# Patient Record
Sex: Female | Born: 1960 | Race: White | Hispanic: No | Marital: Married | Smoking: Former smoker
Health system: Southern US, Community
[De-identification: ages and names within clinical notes are randomized; demographics above are authoritative.]

## PROBLEM LIST (undated history)

## (undated) DIAGNOSIS — N393 Stress incontinence (female) (male): Secondary | ICD-10-CM

## (undated) DIAGNOSIS — J302 Other seasonal allergic rhinitis: Secondary | ICD-10-CM

## (undated) DIAGNOSIS — Z780 Asymptomatic menopausal state: Secondary | ICD-10-CM

## (undated) DIAGNOSIS — M1990 Unspecified osteoarthritis, unspecified site: Secondary | ICD-10-CM

## (undated) DIAGNOSIS — Z973 Presence of spectacles and contact lenses: Secondary | ICD-10-CM

## (undated) DIAGNOSIS — Z90710 Acquired absence of both cervix and uterus: Secondary | ICD-10-CM

## (undated) DIAGNOSIS — Z8742 Personal history of other diseases of the female genital tract: Secondary | ICD-10-CM

## (undated) DIAGNOSIS — N952 Postmenopausal atrophic vaginitis: Secondary | ICD-10-CM

## (undated) DIAGNOSIS — I10 Essential (primary) hypertension: Secondary | ICD-10-CM

## (undated) HISTORY — PX: PARTIAL HYSTERECTOMY: SHX80

## (undated) HISTORY — DX: Personal history of other diseases of the female genital tract: Z87.42

## (undated) HISTORY — DX: Asymptomatic menopausal state: Z78.0

## (undated) HISTORY — DX: Stress incontinence (female) (male): N39.3

## (undated) HISTORY — DX: Acquired absence of both cervix and uterus: Z90.710

## (undated) HISTORY — DX: Postmenopausal atrophic vaginitis: N95.2

## (undated) HISTORY — PX: DILATION AND CURETTAGE OF UTERUS: SHX78

## (undated) HISTORY — PX: TUBAL LIGATION: SHX77

## (undated) HISTORY — PX: ABDOMINAL HYSTERECTOMY: SHX81

---

## 2004-12-06 ENCOUNTER — Ambulatory Visit: Payer: Self-pay | Admitting: Unknown Physician Specialty

## 2004-12-07 ENCOUNTER — Ambulatory Visit: Payer: Self-pay | Admitting: Orthopedic Surgery

## 2007-12-06 ENCOUNTER — Ambulatory Visit: Payer: Self-pay | Admitting: Family Medicine

## 2008-02-29 HISTORY — PX: FOOT ARTHROTOMY: SHX952

## 2013-08-13 LAB — HM PAP SMEAR: HM Pap smear: POSITIVE

## 2013-10-08 ENCOUNTER — Ambulatory Visit: Payer: Self-pay | Admitting: Obstetrics and Gynecology

## 2013-10-09 LAB — HM MAMMOGRAPHY

## 2014-06-12 ENCOUNTER — Other Ambulatory Visit: Payer: Self-pay | Admitting: Orthopedic Surgery

## 2014-06-26 ENCOUNTER — Encounter (HOSPITAL_BASED_OUTPATIENT_CLINIC_OR_DEPARTMENT_OTHER): Payer: Self-pay | Admitting: *Deleted

## 2014-06-26 NOTE — Progress Notes (Signed)
No labs needed

## 2014-06-27 NOTE — H&P (Signed)
Kelsey Holt is an 54 y.o. female.   Chief Complaint: Left knee pain  HPI: Patient returns today with continued pain in her left knee which by MRI scan has a lateral meniscal tear and bicompartmental osteoarthritis.  The workers Production assistant, radio asked for second opinion, which is pending and therefore we are in a holding pattern.  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  Past Medical History  Diagnosis Date  . Seasonal allergies   . Arthritis   . Wears glasses     Past Surgical History  Procedure Laterality Date  . Partial hysterectomy    . Foot arthrotomy  2010    bilateral toes repaired  . Dilation and curettage of uterus    . Tubal ligation    . Abdominal hysterectomy      History reviewed. No pertinent family history. Social History:  reports that she quit smoking about 2 years ago. She does not have any smokeless tobacco history on file. She reports that she drinks alcohol. She reports that she does not use illicit drugs.  Allergies:  Allergies  Allergen Reactions  . Penicillins Hives    No prescriptions prior to admission    No results found for this or any previous visit (from the past 74 hour(s)). No results found.  Review of Systems  Constitutional:       Hot flashes  HENT: Negative.   Eyes: Negative.   Respiratory: Negative.   Cardiovascular: Negative.   Gastrointestinal: Negative.   Genitourinary: Negative.   Musculoskeletal: Positive for joint pain.  Skin: Negative.   Neurological: Negative.   Endo/Heme/Allergies: Negative.   Psychiatric/Behavioral: Negative.     Height 5\' 7"  (1.702 m), weight 87.544 kg (193 lb). Physical Exam  Constitutional: She is oriented to person, place, and time. She appears well-developed and well-nourished.  HENT:  Head: Normocephalic and atraumatic.  Eyes: Pupils are equal, round, and reactive to light.  Neck: Normal range of motion. Neck supple.  Cardiovascular: Intact distal  pulses.   Respiratory: Effort normal.  Musculoskeletal: She exhibits tenderness.  The left knee has a 1-2+ effusion is tender along the lateral joint line.  Motion today is from 0-130.    Neurological: She is alert and oriented to person, place, and time.  Skin: Skin is warm and dry.  Psychiatric: She has a normal mood and affect. Her behavior is normal. Judgment and thought content normal.     Assessment/Plan Assess: Symptomatic lateral meniscal tear.  Date of injury was 09/11/2013 pending approval for arthroscopic decompression.  Plan: Treatment options are discussed with the patient.  The benefits and risks of knee arthroscopy are discussed.  She wishes to proceed with knee arthroscopy.  We anticipate seeing the patient back at the time of surgical intervention and then approximately 10 days post op.  Call with any issuse   Marieclaire Bettenhausen R 06/27/2014, 10:13 AM

## 2014-06-30 ENCOUNTER — Encounter (HOSPITAL_BASED_OUTPATIENT_CLINIC_OR_DEPARTMENT_OTHER)
Admission: RE | Disposition: A | Discharge status: Home / Self Care | Payer: Self-pay | Source: Ambulatory Visit | Attending: Orthopedic Surgery

## 2014-06-30 ENCOUNTER — Ambulatory Visit (HOSPITAL_BASED_OUTPATIENT_CLINIC_OR_DEPARTMENT_OTHER)
Admission: RE | Admit: 2014-06-30 | Discharge: 2014-06-30 | Disposition: A | Discharge status: Home / Self Care | Payer: Worker's Compensation | Source: Ambulatory Visit | Attending: Orthopedic Surgery | Admitting: Orthopedic Surgery

## 2014-06-30 ENCOUNTER — Encounter (HOSPITAL_BASED_OUTPATIENT_CLINIC_OR_DEPARTMENT_OTHER): Payer: Self-pay | Admitting: Anesthesiology

## 2014-06-30 ENCOUNTER — Ambulatory Visit (HOSPITAL_BASED_OUTPATIENT_CLINIC_OR_DEPARTMENT_OTHER): Payer: Worker's Compensation | Admitting: Anesthesiology

## 2014-06-30 DIAGNOSIS — M23222 Derangement of posterior horn of medial meniscus due to old tear or injury, left knee: Secondary | ICD-10-CM | POA: Diagnosis not present

## 2014-06-30 DIAGNOSIS — M1712 Unilateral primary osteoarthritis, left knee: Secondary | ICD-10-CM | POA: Insufficient documentation

## 2014-06-30 DIAGNOSIS — M2342 Loose body in knee, left knee: Secondary | ICD-10-CM | POA: Diagnosis not present

## 2014-06-30 DIAGNOSIS — Z87891 Personal history of nicotine dependence: Secondary | ICD-10-CM | POA: Insufficient documentation

## 2014-06-30 DIAGNOSIS — Z90710 Acquired absence of both cervix and uterus: Secondary | ICD-10-CM | POA: Insufficient documentation

## 2014-06-30 DIAGNOSIS — M23252 Derangement of posterior horn of lateral meniscus due to old tear or injury, left knee: Secondary | ICD-10-CM | POA: Insufficient documentation

## 2014-06-30 DIAGNOSIS — Z880 Allergy status to penicillin: Secondary | ICD-10-CM | POA: Insufficient documentation

## 2014-06-30 DIAGNOSIS — Z9851 Tubal ligation status: Secondary | ICD-10-CM | POA: Diagnosis not present

## 2014-06-30 DIAGNOSIS — S83242A Other tear of medial meniscus, current injury, left knee, initial encounter: Secondary | ICD-10-CM

## 2014-06-30 DIAGNOSIS — M94262 Chondromalacia, left knee: Secondary | ICD-10-CM | POA: Diagnosis not present

## 2014-06-30 HISTORY — DX: Presence of spectacles and contact lenses: Z97.3

## 2014-06-30 HISTORY — DX: Other seasonal allergic rhinitis: J30.2

## 2014-06-30 HISTORY — PX: KNEE ARTHROSCOPY WITH MEDIAL MENISECTOMY: SHX5651

## 2014-06-30 HISTORY — DX: Unspecified osteoarthritis, unspecified site: M19.90

## 2014-06-30 LAB — POCT HEMOGLOBIN-HEMACUE: Hemoglobin: 14.9 g/dL (ref 12.0–15.0)

## 2014-06-30 SURGERY — ARTHROSCOPY, KNEE, WITH MEDIAL MENISCECTOMY
Anesthesia: General | Site: Knee | Laterality: Left

## 2014-06-30 MED ORDER — PROMETHAZINE HCL 25 MG/ML IJ SOLN
6.2500 mg | INTRAMUSCULAR | Status: DC | PRN
Start: 2014-06-30 — End: 2014-06-30

## 2014-06-30 MED ORDER — LACTATED RINGERS IV SOLN
INTRAVENOUS | Status: DC
  Administered 2014-06-30: 10:00:00 via INTRAVENOUS

## 2014-06-30 MED ORDER — FENTANYL CITRATE (PF) 100 MCG/2ML IJ SOLN
INTRAMUSCULAR | Status: AC
  Filled 2014-06-30: qty 4

## 2014-06-30 MED ORDER — CHLORHEXIDINE GLUCONATE 4 % EX LIQD: 60.0000 mL | Freq: Once | CUTANEOUS | Status: DC

## 2014-06-30 MED ORDER — GLYCOPYRROLATE 0.2 MG/ML IJ SOLN: 0.2000 mg | Freq: Once | INTRAMUSCULAR | Status: DC | PRN

## 2014-06-30 MED ORDER — FENTANYL CITRATE (PF) 100 MCG/2ML IJ SOLN
INTRAMUSCULAR | Status: DC | PRN
  Administered 2014-06-30: 100 ug via INTRAVENOUS

## 2014-06-30 MED ORDER — BUPIVACAINE-EPINEPHRINE 0.5% -1:200000 IJ SOLN
INTRAMUSCULAR | Status: DC | PRN
  Administered 2014-06-30: 20 mL

## 2014-06-30 MED ORDER — ONDANSETRON HCL 4 MG/2ML IJ SOLN
INTRAMUSCULAR | Status: DC | PRN
  Administered 2014-06-30: 4 mg via INTRAVENOUS

## 2014-06-30 MED ORDER — PROPOFOL 500 MG/50ML IV EMUL
INTRAVENOUS | Status: AC
  Filled 2014-06-30: qty 50

## 2014-06-30 MED ORDER — MIDAZOLAM HCL 2 MG/2ML IJ SOLN
INTRAMUSCULAR | Status: AC
  Filled 2014-06-30: qty 2

## 2014-06-30 MED ORDER — CEFAZOLIN SODIUM-DEXTROSE 2-3 GM-% IV SOLR
INTRAVENOUS | Status: AC
  Filled 2014-06-30: qty 50

## 2014-06-30 MED ORDER — HYDROMORPHONE HCL 1 MG/ML IJ SOLN
0.2500 mg | INTRAMUSCULAR | Status: DC | PRN
  Administered 2014-06-30 (×2): 0.5 mg via INTRAVENOUS

## 2014-06-30 MED ORDER — BUPIVACAINE HCL (PF) 0.5 % IJ SOLN
INTRAMUSCULAR | Status: AC
  Filled 2014-06-30: qty 30

## 2014-06-30 MED ORDER — HYDROMORPHONE HCL 1 MG/ML IJ SOLN
INTRAMUSCULAR | Status: AC
  Filled 2014-06-30: qty 1

## 2014-06-30 MED ORDER — MIDAZOLAM HCL 2 MG/2ML IJ SOLN
1.0000 mg | INTRAMUSCULAR | Status: DC | PRN
  Administered 2014-06-30: 2 mg via INTRAVENOUS

## 2014-06-30 MED ORDER — FENTANYL CITRATE (PF) 100 MCG/2ML IJ SOLN: 50.0000 ug | INTRAMUSCULAR | Status: DC | PRN

## 2014-06-30 MED ORDER — HYDROCODONE-ACETAMINOPHEN 5-325 MG PO TABS: 1.0000 | ORAL_TABLET | Freq: Four times a day (QID) | ORAL | Status: DC | PRN

## 2014-06-30 MED ORDER — DEXTROSE-NACL 5-0.45 % IV SOLN: INTRAVENOUS | Status: DC

## 2014-06-30 MED ORDER — CEFAZOLIN SODIUM-DEXTROSE 2-3 GM-% IV SOLR
2.0000 g | INTRAVENOUS | Status: AC
  Administered 2014-06-30: 2 g via INTRAVENOUS

## 2014-06-30 MED ORDER — SODIUM CHLORIDE 0.9 % IR SOLN
Status: DC | PRN
  Administered 2014-06-30: 3100 mL

## 2014-06-30 MED ORDER — DEXAMETHASONE SODIUM PHOSPHATE 4 MG/ML IJ SOLN
INTRAMUSCULAR | Status: DC | PRN
  Administered 2014-06-30: 10 mg via INTRAVENOUS

## 2014-06-30 MED ORDER — LIDOCAINE HCL (CARDIAC) 20 MG/ML IV SOLN
INTRAVENOUS | Status: DC | PRN
  Administered 2014-06-30: 80 mg via INTRAVENOUS

## 2014-06-30 MED ORDER — PROPOFOL 10 MG/ML IV BOLUS
INTRAVENOUS | Status: DC | PRN
  Administered 2014-06-30: 200 mg via INTRAVENOUS

## 2014-06-30 SURGICAL SUPPLY — 42 items
BANDAGE ELASTIC 6 VELCRO ST LF (GAUZE/BANDAGES/DRESSINGS) ×4 IMPLANT
BLADE 4.2CUDA (BLADE) IMPLANT
BLADE CUTTER GATOR 3.5 (BLADE) IMPLANT
BLADE GREAT WHITE 4.2 (BLADE) IMPLANT
BLADE GREAT WHITE 4.2MM (BLADE)
BNDG COHESIVE 6X5 TAN STRL LF (GAUZE/BANDAGES/DRESSINGS) ×4 IMPLANT
DRAPE ARTHROSCOPY W/POUCH 114 (DRAPES) ×4 IMPLANT
DURAPREP 26ML APPLICATOR (WOUND CARE) ×4 IMPLANT
ELECT MENISCUS 165MM 90D (ELECTRODE) IMPLANT
ELECT REM PT RETURN 9FT ADLT (ELECTROSURGICAL)
ELECTRODE REM PT RTRN 9FT ADLT (ELECTROSURGICAL) IMPLANT
GAUZE SPONGE 4X4 12PLY STRL (GAUZE/BANDAGES/DRESSINGS) ×4 IMPLANT
GAUZE XEROFORM 1X8 LF (GAUZE/BANDAGES/DRESSINGS) ×4 IMPLANT
GLOVE BIO SURGEON STRL SZ 6.5 (GLOVE) ×3 IMPLANT
GLOVE BIO SURGEON STRL SZ7.5 (GLOVE) ×4 IMPLANT
GLOVE BIO SURGEON STRL SZ8.5 (GLOVE) ×4 IMPLANT
GLOVE BIO SURGEONS STRL SZ 6.5 (GLOVE) ×1
GLOVE BIOGEL M STRL SZ7.5 (GLOVE) ×4 IMPLANT
GLOVE BIOGEL PI IND STRL 8 (GLOVE) ×4 IMPLANT
GLOVE BIOGEL PI IND STRL 9 (GLOVE) ×2 IMPLANT
GLOVE BIOGEL PI INDICATOR 8 (GLOVE) ×4
GLOVE BIOGEL PI INDICATOR 9 (GLOVE) ×2
GOWN STRL REUS W/ TWL LRG LVL3 (GOWN DISPOSABLE) ×2 IMPLANT
GOWN STRL REUS W/TWL LRG LVL3 (GOWN DISPOSABLE) ×2
GOWN STRL REUS W/TWL XL LVL3 (GOWN DISPOSABLE) ×4 IMPLANT
IV NS IRRIG 3000ML ARTHROMATIC (IV SOLUTION) ×4 IMPLANT
KNEE WRAP E Z 3 GEL PACK (MISCELLANEOUS) ×4 IMPLANT
MANIFOLD NEPTUNE II (INSTRUMENTS) ×4 IMPLANT
NDL SAFETY ECLIPSE 18X1.5 (NEEDLE) ×2 IMPLANT
NEEDLE HYPO 18GX1.5 SHARP (NEEDLE) ×2
PACK ARTHROSCOPY DSU (CUSTOM PROCEDURE TRAY) ×4 IMPLANT
PACK BASIN DAY SURGERY FS (CUSTOM PROCEDURE TRAY) ×4 IMPLANT
PAD ALCOHOL SWAB (MISCELLANEOUS) ×4 IMPLANT
PENCIL BUTTON HOLSTER BLD 10FT (ELECTRODE) IMPLANT
SET ARTHROSCOPY TUBING (MISCELLANEOUS) ×2
SET ARTHROSCOPY TUBING LN (MISCELLANEOUS) ×2 IMPLANT
SLEEVE SCD COMPRESS KNEE MED (MISCELLANEOUS) IMPLANT
SYR 3ML 18GX1 1/2 (SYRINGE) IMPLANT
SYR 5ML LL (SYRINGE) ×4 IMPLANT
TOWEL OR 17X24 6PK STRL BLUE (TOWEL DISPOSABLE) ×4 IMPLANT
WAND STAR VAC 90 (SURGICAL WAND) ×4 IMPLANT
WATER STERILE IRR 1000ML POUR (IV SOLUTION) ×4 IMPLANT

## 2014-06-30 NOTE — Discharge Instructions (Addendum)
Arthroscopic Procedure, Knee An arthroscopic procedure can find what is wrong with your knee. PROCEDURE Arthroscopy is a surgical technique that allows your orthopedic surgeon to diagnose and treat your knee injury with accuracy. They will look into your knee through a small instrument. This is almost like a small (pencil sized) telescope. Because arthroscopy affects your knee less than open knee surgery, you can anticipate a more rapid recovery. Taking an active role by following your caregiver's instructions will help with rapid and complete recovery. Use crutches, rest, elevation, ice, and knee exercises as instructed. The length of recovery depends on various factors including type of injury, age, physical condition, medical conditions, and your rehabilitation. Your knee is the joint between the large bones (femur and tibia) in your leg. Cartilage covers these bone ends which are smooth and slippery and allow your knee to bend and move smoothly. Two menisci, thick, semi-lunar shaped pads of cartilage which form a rim inside the joint, help absorb shock and stabilize your knee. Ligaments bind the bones together and support your knee joint. Muscles move the joint, help support your knee, and take stress off the joint itself. Because of this all programs and physical therapy to rehabilitate an injured or repaired knee require rebuilding and strengthening your muscles. AFTER THE PROCEDURE  After the procedure, you will be moved to a recovery area until most of the effects of the medication have worn off. Your caregiver will discuss the test results with you.  Only take over-the-counter or prescription medicines for pain, discomfort, or fever as directed by your caregiver. SEEK MEDICAL CARE IF:   You have increased bleeding from your wounds.  You see redness, swelling, or have increasing pain in your wounds.  You have pus coming from your wound.  You have an oral temperature above 102 F (38.9  C).  You notice a bad smell coming from the wound or dressing.  You have severe pain with any motion of your knee. SEEK IMMEDIATE MEDICAL CARE IF:   You develop a rash.  You have difficulty breathing.  You have any allergic problems. Document Released: 02/12/2000 Document Revised: 05/09/2011 Document Reviewed: 09/05/2007 [facility] Patient Information 2015 [place], [place]. This information is not intended to replace advice given to you by your health care provider. Make sure you discuss any questions you have with your health care provider.    Post Anesthesia Home Care Instructions  Activity: Get plenty of rest for the remainder of the day. A responsible adult should stay with you for 24 hours following the procedure.  For the next 24 hours, DO NOT: -Drive a car -Paediatric nurse -Drink alcoholic beverages -Take any medication unless instructed by your physician -Make any legal decisions or sign important papers.  Meals: Start with liquid foods such as gelatin or soup. Progress to regular foods as tolerated. Avoid greasy, spicy, heavy foods. If nausea and/or vomiting occur, drink only clear liquids until the nausea and/or vomiting subsides. Call your physician if vomiting continues.  Special Instructions/Symptoms: Your throat may feel dry or sore from the anesthesia or the breathing tube placed in your throat during surgery. If this causes discomfort, gargle with warm salt water. The discomfort should disappear within 24 hours.  If you had a scopolamine patch placed behind your ear for the management of post- operative nausea and/or vomiting:  1. The medication in the patch is effective for 72 hours, after which it should be removed.  Wrap patch in a tissue and discard in the trash.  Wash hands thoroughly with soap and water. 2. You may remove the patch earlier than 72 hours if you experience unpleasant side effects which may include dry mouth, dizziness or visual disturbances. 3.  Avoid touching the patch. Wash your hands with soap and water after contact with the patch.

## 2014-06-30 NOTE — Anesthesia Preprocedure Evaluation (Addendum)
Anesthesia Evaluation  Patient identified by MRN, date of birth, ID band Patient awake    Reviewed: Allergy & Precautions, NPO status , Patient's Chart, lab work & pertinent test results  Airway Mallampati: II  TM Distance: >3 FB Neck ROM: Full    Dental   Pulmonary former smoker,  breath sounds clear to auscultation        Cardiovascular negative cardio ROS  Rhythm:Regular Rate:Normal     Neuro/Psych    GI/Hepatic negative GI ROS, Neg liver ROS,   Endo/Other  negative endocrine ROS  Renal/GU negative Renal ROS     Musculoskeletal  (+) Arthritis -,   Abdominal   Peds  Hematology   Anesthesia Other Findings   Reproductive/Obstetrics                          Anesthesia Physical Anesthesia Plan  ASA: II  Anesthesia Plan: General   Post-op Pain Management:    Induction: Intravenous  Airway Management Planned: LMA  Additional Equipment:   Intra-op Plan:   Post-operative Plan: Extubation in OR  Informed Consent:   Dental advisory given  Plan Discussed with: CRNA and Anesthesiologist  Anesthesia Plan Comments:         Anesthesia Quick Evaluation

## 2014-06-30 NOTE — Anesthesia Procedure Notes (Signed)
Procedure Name: LMA Insertion Date/Time: 06/30/2014 11:55 AM Performed by: Maryella Shivers Pre-anesthesia Checklist: Patient identified, Emergency Drugs available, Suction available and Patient being monitored Patient Re-evaluated:Patient Re-evaluated prior to inductionOxygen Delivery Method: Circle System Utilized Preoxygenation: Pre-oxygenation with 100% oxygen Intubation Type: IV induction Ventilation: Mask ventilation without difficulty LMA: LMA inserted LMA Size: 4.0 Number of attempts: 1 Airway Equipment and Method: Bite block Placement Confirmation: positive ETCO2 Tube secured with: Tape Dental Injury: Teeth and Oropharynx as per pre-operative assessment

## 2014-06-30 NOTE — Interval H&P Note (Signed)
History and Physical Interval Note:  06/30/2014 11:39 AM  Kelsey Holt  has presented today for surgery, with the diagnosis of LEFT KNEE MENISCUS TEAR CHONDROMALACIA  The various methods of treatment have been discussed with the patient and family. After consideration of risks, benefits and other options for treatment, the patient has consented to  Procedure(s): LEFT ARTHROSCOPY KNEE (Left) as a surgical intervention .  The patient's history has been reviewed, patient examined, no change in status, stable for surgery.  I have reviewed the patient's chart and labs.  Questions were answered to the patient's satisfaction.     Kerin Salen

## 2014-06-30 NOTE — Anesthesia Postprocedure Evaluation (Signed)
  Anesthesia Post-op Note  Patient: Kelsey Holt  Procedure(s) Performed: Procedure(s) with comments: KNEE ARTHROSCOPY WITH MEDIAL MENISECTOMY (Left) - chondromalatia loose bodly removal partial lateral and medial menisectomy  Patient Location: PACU  Anesthesia Type:General  Level of Consciousness: awake  Airway and Oxygen Therapy: Patient Spontanous Breathing  Post-op Pain: mild  Post-op Assessment: Post-op Vital signs reviewed  Post-op Vital Signs: Reviewed  Last Vitals:  Filed Vitals:   06/30/14 1345  BP:   Pulse:   Temp: 36 C  Resp:     Complications: No apparent anesthesia complications

## 2014-06-30 NOTE — Op Note (Signed)
Pre-Op Dx: Left knee lateral meniscal tear  Postop Dx: Left knee medial and lateral meniscal tears posterior horn complex. BX, cartilaginous loose bodies, grade 3 chondromalacia with flap tears   Procedure: Left knee arthroscopic partial medial and lateral meniscectomies, removal of loose bodies, debridement chondromalacia with flap tears medial and lateral femoral condyles focal  Surgeon: Kathalene Frames. Mayer Camel M.D.  Assist: Kerry Hough. Barton Dubois  (present throughout entire procedure and necessary for timely completion of the procedure) Anes: General LMA  EBL: Minimal  Fluids: 800 cc   Indications: Catching popping and pain with MRI scan consistent with large complex lateral meniscal tear. Pt has failed conservative treatment with anti-inflammatory medicines, physical therapy, and modified activites but did get good temporarily from an intra-articular cortisone injection. Pain has recurred and patient desires elective arthroscopic evaluation and treatment of knee. Risks and benefits of surgery have been discussed and questions answered.  Procedure: Patient identified by arm band and taken to the operating room at the day surgery Center. The appropriate anesthetic monitors were attached, and General LMA anesthesia was induced without difficulty. Lateral post was applied to the table and the lower extremity was prepped and draped in usual sterile fashion from the ankle to the midthigh. Time out procedure was performed. We began the operation by making standard inferior lateral and inferior medial peripatellar portals with a #11 blade allowing introduction of the arthroscope through the inferior lateral portal and the out flow to the inferior medial portal. Pump pressure was set at 100 mmHg and diagnostic arthroscopy  revealed grade 2-3 chondromalacia apex of the patella that was debrided with a 3.5 mm Gator sucker shaver moving into the medial compartment we identified a 2 cm area of grade 3 chondromalacia with  peripheral flap tears to the medial femoral condyle debrided, posterior horn medial meniscal tear parrot-beak and complex debrided with a straight biter and a 35 Gator sucker shaver. Anterior cruciate ligament and PCL are intact. On the lateral side complex parrot-beak tear posterior horn was also debrided as well as chondromalacia grade 3 with flap tears to the lateral femoral condyle. We also encountered a 1 cm cartilaginous loose body on the lateral side as well as removed with the sucker shaver. The knee was irrigated out normal saline solution. A dressing of xerofoam 4 x 4 dressing sponges, web roll and an Ace wrap was applied. The patient was awakened extubated and taken to the recovery without difficulty.    Signed: Kerin Salen, MD

## 2014-06-30 NOTE — Transfer of Care (Signed)
Immediate Anesthesia Transfer of Care Note  Patient: Kelsey Holt  Procedure(s) Performed: Procedure(s) with comments: KNEE ARTHROSCOPY WITH MEDIAL MENISECTOMY (Left) - chondromalatia loose bodly removal partial lateral and medial menisectomy  Patient Location: PACU  Anesthesia Type:General  Level of Consciousness: sedated  Airway & Oxygen Therapy: Patient Spontanous Breathing and Patient connected to face mask oxygen  Post-op Assessment: Report given to RN and Post -op Vital signs reviewed and stable  Post vital signs: Reviewed and stable  Last Vitals:  Filed Vitals:   06/30/14 0946  BP: 133/78  Pulse: 56  Temp: 36.7 C  Resp: 16    Complications: No apparent anesthesia complications

## 2014-07-03 ENCOUNTER — Encounter (HOSPITAL_BASED_OUTPATIENT_CLINIC_OR_DEPARTMENT_OTHER): Payer: Self-pay | Admitting: Orthopedic Surgery

## 2014-08-19 ENCOUNTER — Encounter: Payer: Self-pay | Admitting: Obstetrics and Gynecology

## 2014-11-04 ENCOUNTER — Encounter: Payer: Self-pay | Admitting: Obstetrics and Gynecology

## 2014-11-18 ENCOUNTER — Encounter: Payer: Self-pay | Admitting: Obstetrics and Gynecology

## 2015-01-28 DIAGNOSIS — M159 Polyosteoarthritis, unspecified: Secondary | ICD-10-CM | POA: Insufficient documentation

## 2015-01-28 DIAGNOSIS — R030 Elevated blood-pressure reading, without diagnosis of hypertension: Secondary | ICD-10-CM | POA: Insufficient documentation

## 2015-01-28 DIAGNOSIS — M1712 Unilateral primary osteoarthritis, left knee: Secondary | ICD-10-CM | POA: Insufficient documentation

## 2015-02-25 ENCOUNTER — Other Ambulatory Visit: Payer: Self-pay | Admitting: Internal Medicine

## 2015-02-25 DIAGNOSIS — Z1231 Encounter for screening mammogram for malignant neoplasm of breast: Secondary | ICD-10-CM

## 2015-03-11 ENCOUNTER — Ambulatory Visit
Admission: RE | Admit: 2015-03-11 | Discharge: 2015-03-11 | Disposition: A | Discharge status: Home / Self Care | Payer: Managed Care, Other (non HMO) | Source: Ambulatory Visit | Attending: Internal Medicine | Admitting: Internal Medicine

## 2015-03-11 DIAGNOSIS — Z1231 Encounter for screening mammogram for malignant neoplasm of breast: Secondary | ICD-10-CM | POA: Diagnosis present

## 2015-04-30 ENCOUNTER — Encounter: Payer: Self-pay | Admitting: *Deleted

## 2015-05-01 ENCOUNTER — Ambulatory Visit
Admission: RE | Admit: 2015-05-01 | Discharge: 2015-05-01 | Disposition: A | Discharge status: Home / Self Care | Payer: 59 | Source: Ambulatory Visit | Attending: Gastroenterology | Admitting: Gastroenterology

## 2015-05-01 ENCOUNTER — Ambulatory Visit: Payer: 59 | Admitting: Registered Nurse

## 2015-05-01 ENCOUNTER — Encounter: Payer: Self-pay | Admitting: Anesthesiology

## 2015-05-01 ENCOUNTER — Encounter
Admission: RE | Disposition: A | Discharge status: Home / Self Care | Payer: Self-pay | Source: Ambulatory Visit | Attending: Gastroenterology

## 2015-05-01 DIAGNOSIS — I10 Essential (primary) hypertension: Secondary | ICD-10-CM | POA: Diagnosis not present

## 2015-05-01 DIAGNOSIS — D120 Benign neoplasm of cecum: Secondary | ICD-10-CM | POA: Insufficient documentation

## 2015-05-01 DIAGNOSIS — M1990 Unspecified osteoarthritis, unspecified site: Secondary | ICD-10-CM | POA: Insufficient documentation

## 2015-05-01 DIAGNOSIS — K5730 Diverticulosis of large intestine without perforation or abscess without bleeding: Secondary | ICD-10-CM | POA: Insufficient documentation

## 2015-05-01 DIAGNOSIS — K640 First degree hemorrhoids: Secondary | ICD-10-CM | POA: Insufficient documentation

## 2015-05-01 DIAGNOSIS — Z1211 Encounter for screening for malignant neoplasm of colon: Secondary | ICD-10-CM | POA: Insufficient documentation

## 2015-05-01 DIAGNOSIS — Z90710 Acquired absence of both cervix and uterus: Secondary | ICD-10-CM | POA: Diagnosis not present

## 2015-05-01 DIAGNOSIS — Z87891 Personal history of nicotine dependence: Secondary | ICD-10-CM | POA: Diagnosis not present

## 2015-05-01 DIAGNOSIS — K644 Residual hemorrhoidal skin tags: Secondary | ICD-10-CM | POA: Insufficient documentation

## 2015-05-01 DIAGNOSIS — Z79899 Other long term (current) drug therapy: Secondary | ICD-10-CM | POA: Insufficient documentation

## 2015-05-01 DIAGNOSIS — Z880 Allergy status to penicillin: Secondary | ICD-10-CM | POA: Insufficient documentation

## 2015-05-01 HISTORY — DX: Essential (primary) hypertension: I10

## 2015-05-01 HISTORY — PX: COLONOSCOPY WITH PROPOFOL: SHX5780

## 2015-05-01 SURGERY — COLONOSCOPY WITH PROPOFOL
Anesthesia: General

## 2015-05-01 MED ORDER — PROPOFOL 500 MG/50ML IV EMUL
INTRAVENOUS | Status: DC | PRN
  Administered 2015-05-01: 175 ug/kg/min via INTRAVENOUS

## 2015-05-01 MED ORDER — MIDAZOLAM HCL 2 MG/2ML IJ SOLN
INTRAMUSCULAR | Status: DC | PRN
  Administered 2015-05-01: 1 mg via INTRAVENOUS

## 2015-05-01 MED ORDER — LIDOCAINE HCL (PF) 1 % IJ SOLN
2.0000 mL | Freq: Once | INTRAMUSCULAR | Status: AC
  Administered 2015-05-01: 0.03 mL via INTRADERMAL

## 2015-05-01 MED ORDER — FENTANYL CITRATE (PF) 100 MCG/2ML IJ SOLN
INTRAMUSCULAR | Status: DC | PRN
  Administered 2015-05-01: 50 ug via INTRAVENOUS

## 2015-05-01 MED ORDER — LIDOCAINE HCL (CARDIAC) 20 MG/ML IV SOLN
INTRAVENOUS | Status: DC | PRN
  Administered 2015-05-01: 40 mg via INTRAVENOUS

## 2015-05-01 MED ORDER — SODIUM CHLORIDE 0.9 % IV SOLN
INTRAVENOUS | Status: DC
  Administered 2015-05-01: 1000 mL via INTRAVENOUS

## 2015-05-01 MED ORDER — LIDOCAINE HCL (PF) 1 % IJ SOLN
INTRAMUSCULAR | Status: AC
  Administered 2015-05-01: 0.03 mL via INTRADERMAL
  Filled 2015-05-01: qty 2

## 2015-05-01 MED ORDER — PROPOFOL 10 MG/ML IV BOLUS
INTRAVENOUS | Status: DC | PRN
  Administered 2015-05-01: 50 mg via INTRAVENOUS

## 2015-05-01 NOTE — Anesthesia Procedure Notes (Signed)
Date/Time: 05/01/2015 10:19 AM Performed by: Doreen Salvage Pre-anesthesia Checklist: Patient identified, Emergency Drugs available, Suction available and Patient being monitored Patient Re-evaluated:Patient Re-evaluated prior to inductionOxygen Delivery Method: Nasal cannula Intubation Type: IV induction Dental Injury: Teeth and Oropharynx as per pre-operative assessment  Comments: Nasal cannula with etCO2 monitoring

## 2015-05-01 NOTE — Discharge Instructions (Signed)

## 2015-05-01 NOTE — Anesthesia Postprocedure Evaluation (Signed)
Anesthesia Post Note  Patient: Kelsey Holt  Procedure(s) Performed: Procedure(s) (LRB): COLONOSCOPY WITH PROPOFOL (N/A)  Patient location during evaluation: Endoscopy Anesthesia Type: General Level of consciousness: awake and alert Pain management: pain level controlled Vital Signs Assessment: post-procedure vital signs reviewed and stable Respiratory status: spontaneous breathing, nonlabored ventilation, respiratory function stable and patient connected to nasal cannula oxygen Cardiovascular status: blood pressure returned to baseline and stable Postop Assessment: no signs of nausea or vomiting Anesthetic complications: no    Last Vitals:  Filed Vitals:   05/01/15 1120 05/01/15 1125  BP:  117/71  Pulse: 62 65  Temp:    Resp: 17 14    Last Pain: There were no vitals filed for this visit.               Precious Haws Piscitello

## 2015-05-01 NOTE — Op Note (Signed)
[facility] Gastroenterology Patient Name: Kelsey Holt Procedure Date: 05/01/2015 10:16 AM MRN: [id number] Account #: [id number] Date of Birth: [birth year] Admit Type: Outpatient Age: 55 Room: [facility] ENDO ROOM 2 Gender: Female Note Status: Finalized Procedure:            Colonoscopy Indications:          Screening for colorectal malignant neoplasm, This is                        the patient's first colonoscopy Patient Profile:      This is a 55 year old female. Providers:            Gerrit Heck. Rayann Heman, MD Referring MD:         Leonie Douglas. Doy Hutching, MD (Referring MD) Medicines:            Propofol per Anesthesia Complications:        No immediate complications. Procedure:            Pre-Anesthesia Assessment:                       - Prior to the procedure, a History and Physical was                        performed, and patient medications, allergies and                        sensitivities were reviewed. The patient's tolerance of                        previous anesthesia was reviewed.                       After obtaining informed consent, the colonoscope was                        passed under direct vision. Throughout the procedure,                        the patient's blood pressure, pulse, and oxygen                        saturations were monitored continuously. The                        Colonoscope was introduced through the anus and                        advanced to the the terminal ileum. The colonoscopy was                        somewhat difficult due to significant looping.                        Successful completion of the procedure was aided by                        using manual pressure. The patient tolerated the                        procedure well. The quality of the bowel preparation  was excellent. Findings:      The perianal exam findings include non-thrombosed external hemorrhoids.      A 3 mm polyp was found in the  cecum. The polyp was sessile. The polyp       was removed with a jumbo cold forceps. Resection and retrieval were       complete.      Internal hemorrhoids were found during retroflexion. The hemorrhoids       were Grade I (internal hemorrhoids that do not prolapse).      Many small and large-mouthed diverticula were found in the sigmoid colon.      The exam was otherwise without abnormality. Impression:           - Non-thrombosed external hemorrhoids found on perianal                        exam.                       - One 3 mm polyp in the cecum, removed with a jumbo                        cold forceps. Resected and retrieved.                       - Internal hemorrhoids.                       - Diverticulosis in the sigmoid colon.                       - The examination was otherwise normal. Recommendation:       - Observe patient in GI recovery unit.                       - High fiber diet.                       - Continue present medications.                       - Await pathology results.                       - Repeat colonoscopy for surveillance based on                        pathology results.                       - Return to referring physician.                       - The findings and recommendations were discussed with                        the patient.                       - The findings and recommendations were discussed with                        the patient's family. Procedure Code(s):    --- Professional ---  45380, Colonoscopy, flexible; with biopsy, single or                        multiple Diagnosis Code(s):    --- Professional ---                       Z12.11, Encounter for screening for malignant neoplasm                        of colon                       D12.0, Benign neoplasm of cecum                       K64.0, First degree hemorrhoids                       K64.4, Residual hemorrhoidal skin tags                       K57.30,  Diverticulosis of large intestine without                        perforation or abscess without bleeding CPT copyright 2016 [employer]. All rights reserved. The codes documented in this report are preliminary and upon coder review may  be revised to meet current compliance requirements. Mellody Life, MD 05/01/2015 10:50:35 AM This report has been signed electronically. Number of Addenda: 0 Note Initiated On: 05/01/2015 10:16 AM Scope Withdrawal Time: 0 hours 11 minutes 7 seconds  Total Procedure Duration: 0 hours 22 minutes 13 seconds       [facility]

## 2015-05-01 NOTE — Anesthesia Preprocedure Evaluation (Signed)
Anesthesia Evaluation  Patient identified by MRN, date of birth, ID band Patient awake    Reviewed: Allergy & Precautions, H&P , NPO status , Patient's Chart, lab work & pertinent test results  Airway Mallampati: III  TM Distance: >3 FB Neck ROM: limited    Dental  (+) Poor Dentition, Chipped   Pulmonary neg pulmonary ROS, neg shortness of breath, former smoker,    Pulmonary exam normal breath sounds clear to auscultation       Cardiovascular Exercise Tolerance: Good hypertension, (-) angina(-) Past MI and (-) DOE Normal cardiovascular exam Rhythm:regular Rate:Normal     Neuro/Psych negative neurological ROS  negative psych ROS   GI/Hepatic negative GI ROS, Neg liver ROS,   Endo/Other  negative endocrine ROS  Renal/GU negative Renal ROS  negative genitourinary   Musculoskeletal  (+) Arthritis ,   Abdominal   Peds  Hematology negative hematology ROS (+)   Anesthesia Other Findings Past Medical History:   Seasonal allergies                                           Arthritis                                                    Wears glasses                                                History of heavy periods                                     Menopause                                                    S/P vaginal hysterectomy                                       Comment:d/t menorroghia   SUI (stress urinary incontinence, female)                    Vaginal atrophy                                              Hypertension                                                   Comment:borderline  Past Surgical History:   PARTIAL HYSTERECTOMY  FOOT ARTHROTOMY                                  2010           Comment:bilateral toes repaired   DILATION AND CURETTAGE OF UTERUS                              TUBAL LIGATION                                                ABDOMINAL  HYSTERECTOMY                                        KNEE ARTHROSCOPY WITH MEDIAL MENISECTOMY        Left 06/30/2014       Comment:Procedure: KNEE ARTHROSCOPY WITH MEDIAL               MENISECTOMY;  Surgeon: Frederik Pear, MD;                Location: [facility];  Service:               Orthopedics;  Laterality: Left;  chondromalatia              loose bodly removal partial lateral and medial               menisectomy  BMI    Body Mass Index   31.00 kg/m 2      Reproductive/Obstetrics                             Anesthesia Physical Anesthesia Plan  ASA: III  Anesthesia Plan: General   Post-op Pain Management:    Induction:   Airway Management Planned:   Additional Equipment:   Intra-op Plan:   Post-operative Plan:   Informed Consent: I have reviewed the patients History and Physical, chart, labs and discussed the procedure including the risks, benefits and alternatives for the proposed anesthesia with the patient or authorized representative who has indicated his/her understanding and acceptance.   Dental Advisory Given  Plan Discussed with: Anesthesiologist, CRNA and Surgeon  Anesthesia Plan Comments:         Anesthesia Quick Evaluation

## 2015-05-01 NOTE — Transfer of Care (Signed)
Immediate Anesthesia Transfer of Care Note  Patient: Kelsey Holt  Procedure(s) Performed: Procedure(s): COLONOSCOPY WITH PROPOFOL (N/A)  Patient Location: PACU and Endoscopy Unit  Anesthesia Type:General  Level of Consciousness: sedated  Airway & Oxygen Therapy: Patient Spontanous Breathing and Patient connected to nasal cannula oxygen  Post-op Assessment: Report given to RN and Post -op Vital signs reviewed and stable  Post vital signs: Reviewed and stable  Last Vitals:  Filed Vitals:   05/01/15 1054 05/01/15 1055  BP: 104/59   Pulse: 78   Temp: 35.9 C 36.9 C  Resp: 12     Complications: No apparent anesthesia complications

## 2015-05-01 NOTE — H&P (Signed)
Primary Care Physician:  Idelle Crouch, MD  Pre-Procedure History & Physical: HPI:  Kelsey Holt is a 55 y.o. female is here for an colonoscopy.   Past Medical History  Diagnosis Date  . Seasonal allergies   . Arthritis   . Wears glasses   . History of heavy periods   . Menopause   . S/P vaginal hysterectomy     d/t menorroghia  . SUI (stress urinary incontinence, female)   . Vaginal atrophy   . Hypertension     borderline    Past Surgical History  Procedure Laterality Date  . Partial hysterectomy    . Foot arthrotomy  2010    bilateral toes repaired  . Dilation and curettage of uterus    . Tubal ligation    . Abdominal hysterectomy    . Knee arthroscopy with medial menisectomy Left 06/30/2014    Procedure: KNEE ARTHROSCOPY WITH MEDIAL MENISECTOMY;  Surgeon: Frederik Pear, MD;  Location: [facility];  Service: Orthopedics;  Laterality: Left;  chondromalatia loose bodly removal partial lateral and medial menisectomy    Prior to Admission medications   Medication Sig Start Date End Date Taking? Authorizing Provider  calcium citrate-vitamin D (CITRACAL+D) 315-200 MG-UNIT tablet Take 2 tablets by mouth 2 (two) times daily. With meals   Yes Historical Provider, MD  calcium carbonate (OS-CAL) 600 MG TABS tablet Take 600 mg by mouth daily.    Historical Provider, MD  cetirizine (ZYRTEC) 10 MG tablet Take 10 mg by mouth daily.    Historical Provider, MD  HYDROcodone-acetaminophen (NORCO) 5-325 MG per tablet Take 1 tablet by mouth every 6 (six) hours as needed. 06/30/14   Leighton Parody, PA-C  meloxicam (MOBIC) 15 MG tablet Take 15 mg by mouth daily.    Historical Provider, MD  polyethylene glycol (MIRALAX / GLYCOLAX) packet Take 17 g by mouth daily.    Historical Provider, MD    Allergies as of 04/22/2015 - Review Complete 06/30/2014  Allergen Reaction Noted  . Penicillins Hives 06/26/2014    History reviewed. No pertinent family history.  Social History    Social History  . Marital Status: Married    Spouse Name: N/A  . Number of Children: N/A  . Years of Education: N/A   Occupational History  . Not on file.   Social History Main Topics  . Smoking status: Former Smoker    Quit date: 06/25/2012  . Smokeless tobacco: Former Systems developer  . Alcohol Use: Yes     Comment: occ  . Drug Use: No  . Sexual Activity: Not on file   Other Topics Concern  . Not on file   Social History Narrative     Physical Exam: BP 142/69 mmHg  Pulse 69  Temp(Src) 96.4 F (35.8 C) (Tympanic)  Resp 17  Ht 5\' 7"  (1.702 m)  Wt 89.812 kg (198 lb)  BMI 31.00 kg/m2  SpO2 99% General:   Alert,  pleasant and cooperative in NAD Head:  Normocephalic and atraumatic. Neck:  Supple; no masses or thyromegaly. Lungs:  Clear throughout to auscultation.    Heart:  Regular rate and rhythm. Abdomen:  Soft, nontender and nondistended. Normal bowel sounds, without guarding, and without rebound.   Neurologic:  Alert and  oriented x4;  grossly normal neurologically.  Impression/Plan: Kelsey Holt is here for an colonoscopy to be performed for screening  Risks, benefits, limitations, and alternatives regarding  colonoscopy have been reviewed with the patient.  Questions have been answered.  All parties agreeable.   Josefine Class, MD  05/01/2015, 10:15 AM

## 2015-05-04 ENCOUNTER — Encounter: Payer: Self-pay | Admitting: Gastroenterology

## 2015-05-04 LAB — SURGICAL PATHOLOGY

## 2015-10-21 DIAGNOSIS — E669 Obesity, unspecified: Secondary | ICD-10-CM | POA: Insufficient documentation

## 2015-11-23 NOTE — Progress Notes (Signed)
ANNUAL PREVENTATIVE CARE GYN  ENCOUNTER NOTE  Subjective:       Kelsey Holt is a 55 y.o. No obstetric history on file. female here for a routine annual gynecologic exam.  Current complaints: 1.   Wants something for hot flashes  Colonoscopy this year was normal. Mammogram is already been obtained. Bowel function is normal using 1 teaspoon MiraLAX daily Stress urinary incontinence is ongoing, stable Needs Pap smear this year due to ASCUS/positive Pap smear in 2015, subsequent negative colposcopy   Gynecologic History No LMP recorded. Patient has had a hysterectomy. Contraception: status post hysterectomy Last Pap: 07/2013 ascus/pos 10/2013 wnl colpo. Results were: abnormal Last mammogram: 03/2015 birad 1. Results were: normal  Obstetric History OB History  No data available    Past Medical History:  Diagnosis Date  . Arthritis   . History of heavy periods   . Hypertension    borderline  . Menopause   . S/P vaginal hysterectomy    d/t menorroghia  . Seasonal allergies   . SUI (stress urinary incontinence, female)   . Vaginal atrophy   . Wears glasses     Past Surgical History:  Procedure Laterality Date  . ABDOMINAL HYSTERECTOMY    . COLONOSCOPY WITH PROPOFOL N/A 05/01/2015   Procedure: COLONOSCOPY WITH PROPOFOL;  Surgeon: Josefine Class, MD;  Location: [facility] ENDOSCOPY;  Service: Endoscopy;  Laterality: N/A;  . DILATION AND CURETTAGE OF UTERUS    . FOOT ARTHROTOMY  2010   bilateral toes repaired  . 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 PARTIAL HYSTERECTOMY    . TUBAL LIGATION      Current Outpatient Prescriptions on File Prior to Visit  Medication Sig Dispense Refill  . calcium carbonate (OS-CAL) 600 MG  TABS tablet Take 600 mg by mouth daily.    . calcium citrate-vitamin D (CITRACAL+D) 315-200 MG-UNIT tablet Take 2 tablets by mouth 2 (two) times daily. With meals    . cetirizine (ZYRTEC) 10 MG tablet Take 10 mg by mouth daily.    Marland Kitchen HYDROcodone-acetaminophen (NORCO) 5-325 MG per tablet Take 1 tablet by mouth every 6 (six) hours as needed. 60 tablet 0  . meloxicam (MOBIC) 15 MG tablet Take 15 mg by mouth daily.    . polyethylene glycol (MIRALAX / GLYCOLAX) packet Take 17 g by mouth daily.     No current facility-administered medications on file prior to visit.     Allergies  Allergen Reactions  . Penicillins Hives    Social History   Social History  . Marital status: Married    Spouse name: N/A  . Number of children: N/A  . Years of education: N/A   Occupational History  . Not on file.   Social History Main Topics  . Smoking status: Former Smoker    Quit date: 06/25/2012  . Smokeless tobacco: Former Systems developer  . Alcohol use Yes     Comment: occ  . Drug use: No  . Sexual activity: Not on file   Other Topics Concern  . Not on file   Social History Narrative  . No narrative on file    No family history on file.  The following portions of the patient's history were reviewed and updated as appropriate: allergies, current medications, past family history, past  medical history, past social history, past surgical history and problem list.  Review of Systems ROS Review of Systems - General ROS: negative for - chills, fatigue, fever, hot flashes, night sweats, weight gain or weight loss Psychological ROS: negative for - anxiety, decreased libido, depression, mood swings, physical abuse or sexual abuse Ophthalmic ROS: negative for - blurry vision, eye pain or loss of vision ENT ROS: negative for - headaches, hearing change, visual changes or vocal changes Allergy and Immunology ROS: negative for - hives, itchy/watery eyes or seasonal allergies Hematological and Lymphatic ROS:  negative for - bleeding problems, bruising, swollen lymph nodes or weight loss Endocrine ROS: negative for - galactorrhea, hair pattern changes, hot flashes, malaise/lethargy, mood swings, palpitations, polydipsia/polyuria, skin changes, temperature intolerance or unexpected weight changes Breast ROS: negative for - new or changing breast lumps or nipple discharge Respiratory ROS: negative for - cough or shortness of breath Cardiovascular ROS: negative for - chest pain, irregular heartbeat, palpitations or shortness of breath Gastrointestinal ROS: no abdominal pain, change in bowel habits, or black or bloody stools Genito-Urinary ROS: no dysuria, trouble voiding, or hematuria Musculoskeletal ROS: negative for - joint pain or joint stiffness Neurological ROS: negative for - bowel and bladder control changes Dermatological ROS: negative for rash and skin lesion changes   Objective:   BP (!) 153/77   Pulse 75   Ht 5\' 7"  (1.702 m)   Wt 192 lb 8 oz (87.3 kg)   BMI 30.15 kg/m  CONSTITUTIONAL: Well-developed, well-nourished female in no acute distress.  PSYCHIATRIC: Normal mood and affect. Normal behavior. Normal judgment and thought content. Branch: Alert and oriented to person, place, and time. Normal muscle tone coordination. No cranial nerve deficit noted. HENT:  Normocephalic, atraumatic, External right and left ear normal. Oropharynx is clear and moist EYES: Conjunctivae and EOM are normal. Pupils are equal, round, and reactive to light. No scleral icterus.  NECK: Normal range of motion, supple, no masses.  Normal thyroid.  SKIN: Skin is warm and dry. No rash noted. Not diaphoretic. No erythema. No pallor. CARDIOVASCULAR: Normal heart rate noted, regular rhythm, no murmur. RESPIRATORY: Clear to auscultation bilaterally. Effort and breath sounds normal, no problems with respiration noted. BREASTS: Symmetric in size. No masses, skin changes, nipple drainage, or lymphadenopathy. ABDOMEN:  Soft, normal bowel sounds, no distention noted.  No tenderness, rebound or guarding.  BLADDER: Normal PELVIC:  External Genitalia: Normal  BUS: Normal  Vagina: Moderate atrophy  Cervix: Surgically absent  Uterus: Surgically absent  Adnexa: Normal  RV: External Exam NormaI, No Rectal Masses and Normal Sphincter tone  MUSCULOSKELETAL: Normal range of motion. No tenderness.  No cyanosis, clubbing, or edema.  2+ distal pulses. LYMPHATIC: No Axillary, Supraclavicular, or Inguinal Adenopathy.    Assessment:   Annual gynecologic examination 55 y.o. Contraception: status post hysterectomy TVH bmi-30 Vasomotor symptoms  Plan:  Pap: Pap Co Test Mammogram: utd Stool Guaiac Testing:  colonoscopy- 04/2015 Labs:Thru pcp Routine preventative health maintenance measures emphasized: Exercise/Diet/Weight control, Tobacco Warnings and Alcohol/Substance use risks Trial of estradiol 1 mg a day Return in 6 weeks for follow-up Return to [place], CMA  Brayton Mars, MD  Note: This dictation was prepared with Dragon dictation along with smaller phrase technology. Any transcriptional errors that result from this process are unintentional.

## 2015-11-25 ENCOUNTER — Encounter: Payer: Self-pay | Admitting: Obstetrics and Gynecology

## 2015-11-25 ENCOUNTER — Ambulatory Visit (INDEPENDENT_AMBULATORY_CARE_PROVIDER_SITE_OTHER): Payer: 59 | Admitting: Obstetrics and Gynecology

## 2015-11-25 VITALS — BP 153/77 | HR 75 | Ht 67.0 in | Wt 192.5 lb

## 2015-11-25 DIAGNOSIS — N951 Menopausal and female climacteric states: Secondary | ICD-10-CM

## 2015-11-25 DIAGNOSIS — Z90710 Acquired absence of both cervix and uterus: Secondary | ICD-10-CM

## 2015-11-25 DIAGNOSIS — E669 Obesity, unspecified: Secondary | ICD-10-CM

## 2015-11-25 DIAGNOSIS — Z01419 Encounter for gynecological examination (general) (routine) without abnormal findings: Secondary | ICD-10-CM

## 2015-11-25 DIAGNOSIS — N393 Stress incontinence (female) (male): Secondary | ICD-10-CM | POA: Diagnosis not present

## 2015-11-25 MED ORDER — ESTRADIOL 1 MG PO TABS: 1.0000 mg | ORAL_TABLET | Freq: Every day | ORAL | 1 refills | Status: DC

## 2015-11-25 NOTE — Addendum Note (Signed)
Addended by: Elouise Munroe on: 11/25/2015 03:44 PM   Modules accepted: Orders

## 2015-11-25 NOTE — Patient Instructions (Signed)
1. Pap smear is done 2. Mammogram has already been completed this year 3. Colon cancer screening is completed through colonoscopy this year 4. Continue with calcium and vitamin D supplementation daily 5. Begin a trial of estradiol 1 mg orally daily 6. Return in 6 weeks for follow-up on ERT 7. Continue with healthy eating and exercise with weight loss 8. Return in 1 year for annual exam

## 2015-12-02 LAB — PAP IG AND HPV HIGH-RISK: PAP Smear Comment: 0

## 2015-12-02 LAB — HPV, LOW VOLUME (REFLEX): HPV low volume reflex: NEGATIVE

## 2015-12-03 ENCOUNTER — Encounter: Payer: Self-pay | Admitting: Obstetrics and Gynecology

## 2015-12-04 ENCOUNTER — Telehealth: Payer: Self-pay | Admitting: Obstetrics and Gynecology

## 2015-12-07 ENCOUNTER — Telehealth: Payer: Self-pay

## 2015-12-07 NOTE — Telephone Encounter (Signed)
Pt states that since starting estradiol 1 mg daily (10 days ago) she has been having a bloody d/c from both breast. One more than the other. Breast are a little tender. NO redness, swelling, pain, or fever. Advise will send message to mad.

## 2015-12-08 NOTE — Telephone Encounter (Signed)
Pt states at this time she is unable to come in for an appt. She has stopped the estradiol 3 days ago. Nipple discharge has slowed down. Pt will contact office when she is able to make an appt.

## 2015-12-08 NOTE — Telephone Encounter (Signed)
Lm for pt to contact office for an appt.

## 2016-01-06 ENCOUNTER — Ambulatory Visit: Payer: 59 | Admitting: Obstetrics and Gynecology

## 2016-02-15 NOTE — Telephone Encounter (Signed)
error 

## 2016-04-06 ENCOUNTER — Ambulatory Visit: Payer: Managed Care, Other (non HMO) | Attending: Internal Medicine

## 2016-04-06 ENCOUNTER — Other Ambulatory Visit: Payer: Self-pay | Admitting: Internal Medicine

## 2016-04-06 DIAGNOSIS — Z1231 Encounter for screening mammogram for malignant neoplasm of breast: Secondary | ICD-10-CM

## 2016-05-18 ENCOUNTER — Ambulatory Visit
Admission: RE | Admit: 2016-05-18 | Discharge: 2016-05-18 | Disposition: A | Discharge status: Home / Self Care | Payer: 59 | Source: Ambulatory Visit | Attending: Internal Medicine | Admitting: Internal Medicine

## 2016-05-18 DIAGNOSIS — Z1231 Encounter for screening mammogram for malignant neoplasm of breast: Secondary | ICD-10-CM

## 2016-11-24 NOTE — Progress Notes (Signed)
ANNUAL PREVENTATIVE CARE GYN  ENCOUNTER NOTE  Subjective:       Kelsey Holt is a 56 y.o. No obstetric history on file. female here for a routine annual gynecologic exam.  Current complaints: 1.   Hot flashes and nite sweats- may want to go back on estadiol 1 mg- stopped but unsure why  Colonoscopy 04/2015 was normal. Mammogram is already been obtained. Bowel function is normal using 1 teaspoon MiraLAX daily Stress urinary incontinence is ongoing, stable    Gynecologic History No LMP recorded. Patient has had a hysterectomy. Contraception: status post hysterectomy Last Pap:10/2015 neg/neg Results were: abnormal Last mammogram: 05/18/2016  birad 1. Results were: normal  Obstetric History OB History  Gravida Para Term Preterm AB Living  4 3 3   1 3   SAB TAB Ectopic Multiple Live Births  1       3    # Outcome Date GA Lbr Len/2nd Weight Sex Delivery Anes PTL Lv  4 Term 1987   8 lb 2.2 oz (3.692 kg) F Vag-Spont   LIV  3 Term 1985   7 lb 2.1 oz (3.234 kg) F Vag-Spont   LIV  2 SAB 1984          1 Term 1977   7 lb 2.2 oz (3.239 kg) F Vag-Spont   LIV      Past Medical History:  Diagnosis Date  . Arthritis   . History of heavy periods   . Hypertension    borderline  . Menopause   . S/P vaginal hysterectomy    d/t menorroghia  . Seasonal allergies   . SUI (stress urinary incontinence, female)   . Vaginal atrophy   . Wears glasses     Past Surgical History:  Procedure Laterality Date  . ABDOMINAL HYSTERECTOMY    . COLONOSCOPY WITH PROPOFOL N/A 05/01/2015   Procedure: COLONOSCOPY WITH PROPOFOL;  Surgeon: Josefine Class, MD;  Location[facility] ENDOSCOPY;  Service: Endoscopy;  Laterality: N/A;  . DILATION AND CURETTAGE OF UTERUS    . FOOT ARTHROTOMY  2010   bilateral toes repaired  . KNEE ARTHROSCOPY WITH MEDIAL MENISECTOMY Left 06/30/2014   Procedure: KNEE ARTHROSCOPY WITH MEDIAL MENISECTOMY;  Surgeon: Frederik Pear, MD;  Location: [place];  Service:  Orthopedics;  Laterality: Left;  chondromalatia loose bodly removal partial lateral and medial menisectomy  . PARTIAL HYSTERECTOMY    . TUBAL LIGATION      Current Outpatient Prescriptions on File Prior to Visit  Medication Sig Dispense Refill  . estradiol (ESTRACE) 1 MG tablet Take 1 tablet (1 mg total) by mouth daily. 30 tablet 1  . polyethylene glycol (MIRALAX / GLYCOLAX) packet Take 17 g by mouth daily.    Marland Kitchen topiramate (TOPAMAX) 50 MG tablet Take by mouth.     No current facility-administered medications on file prior to visit.     Allergies  Allergen Reactions  . Penicillins Hives    Social History   Social History  . Marital status: Married    Spouse name: N/A  . Number of children: N/A  . Years of education: N/A   Occupational History  . Not on file.   Social History Main Topics  . Smoking status: Former Smoker    Quit date: 06/25/2012  . Smokeless tobacco: Former Systems developer  . Alcohol use Yes     Comment: occ  . Drug use: No  . Sexual activity: Yes    Birth control/ protection: Surgical   Other  Topics Concern  . Not on file   Social History Narrative  . No narrative on file    Family History  Problem Relation Age of Onset  . Breast cancer Neg Hx   . Ovarian cancer Neg Hx   . Colon cancer Neg Hx   . Diabetes Neg Hx   . Heart disease Neg Hx     The following portions of the patient's history were reviewed and updated as appropriate: allergies, current medications, past family history, past medical history, past social history, past surgical history and problem list.  Review of Systems Review of Systems  Constitutional:       Vasomotor symptoms, severe  HENT: Negative.   Eyes: Negative.   Respiratory: Negative.   Cardiovascular: Negative.   Gastrointestinal: Negative.   Genitourinary: Negative.   Musculoskeletal: Negative.   Skin: Negative.   Neurological: Negative.   Endo/Heme/Allergies: Negative.   Psychiatric/Behavioral: Negative.       Objective:   BP (!) 154/73   Pulse 74   Ht 5\' 7"  (1.702 m)   Wt 188 lb 11.2 oz (85.6 kg)   BMI 29.55 kg/m  CONSTITUTIONAL: Well-developed, well-nourished female in no acute distress.  PSYCHIATRIC: Normal mood and affect. Normal behavior. Normal judgment and thought content. Katie: Alert and oriented to person, place, and time. Normal muscle tone coordination. No cranial nerve deficit noted. HENT:  Normocephalic, atraumatic, External right and left ear normal. Oropharynx is clear and moist EYES: Conjunctivae and EOM are normal. Pupils are equal, round, and reactive to light. No scleral icterus.  NECK: Normal range of motion, supple, no masses.  Normal thyroid.  SKIN: Skin is warm and dry. No rash noted. Not diaphoretic. No erythema. No pallor. CARDIOVASCULAR: Normal heart rate noted, regular rhythm, no murmur. RESPIRATORY: Clear to auscultation bilaterally. Effort and breath sounds normal, no problems with respiration noted. BREASTS: Symmetric in size. No masses, skin changes, nipple drainage, or lymphadenopathy. ABDOMEN: Soft, normal bowel sounds, no distention noted.  No tenderness, rebound or guarding.  BLADDER: Normal PELVIC:  External Genitalia: Normal  BUS: Normal  Vagina: Moderate atrophy  Cervix: Surgically absent  Uterus: Surgically absent  Adnexa: Normal  RV: External Exam NormaI, No Rectal Masses and Normal Sphincter tone  MUSCULOSKELETAL: Normal range of motion. No tenderness.  No cyanosis, clubbing, or edema.  2+ distal pulses. LYMPHATIC: No Axillary, Supraclavicular, or Inguinal Adenopathy.    Assessment:   Annual gynecologic examination 56 y.o. Contraception: status post hysterectomy TVH bmi-29 Vasomotor symptoms  Plan:  Pap: Due 2020 Mammogram: utd Stool Guaiac Testing:  ordered Labs:Thru pcp Routine preventative health maintenance measures emphasized: Exercise/Diet/Weight control, Tobacco Warnings and Alcohol/Substance use risks  Begin estradiol 1  mg a day Return to [place], CMA  Brayton Mars, MD   Note: This dictation was prepared with Dragon dictation along with smaller phrase technology. Any transcriptional errors that result from this process are unintentional.

## 2016-11-30 ENCOUNTER — Ambulatory Visit (INDEPENDENT_AMBULATORY_CARE_PROVIDER_SITE_OTHER): Payer: 59 | Admitting: Obstetrics and Gynecology

## 2016-11-30 ENCOUNTER — Encounter: Payer: Self-pay | Admitting: Obstetrics and Gynecology

## 2016-11-30 VITALS — BP 154/73 | HR 74 | Ht 67.0 in | Wt 188.7 lb

## 2016-11-30 DIAGNOSIS — Z780 Asymptomatic menopausal state: Secondary | ICD-10-CM

## 2016-11-30 DIAGNOSIS — Z01419 Encounter for gynecological examination (general) (routine) without abnormal findings: Secondary | ICD-10-CM | POA: Diagnosis not present

## 2016-11-30 DIAGNOSIS — Z1211 Encounter for screening for malignant neoplasm of colon: Secondary | ICD-10-CM

## 2016-11-30 DIAGNOSIS — N952 Postmenopausal atrophic vaginitis: Secondary | ICD-10-CM | POA: Diagnosis not present

## 2016-11-30 DIAGNOSIS — N951 Menopausal and female climacteric states: Secondary | ICD-10-CM | POA: Diagnosis not present

## 2016-11-30 DIAGNOSIS — Z90710 Acquired absence of both cervix and uterus: Secondary | ICD-10-CM

## 2016-11-30 DIAGNOSIS — N393 Stress incontinence (female) (male): Secondary | ICD-10-CM | POA: Diagnosis not present

## 2016-11-30 DIAGNOSIS — E669 Obesity, unspecified: Secondary | ICD-10-CM

## 2016-11-30 MED ORDER — ESTRADIOL 1 MG PO TABS: 1.0000 mg | ORAL_TABLET | Freq: Every day | ORAL | 3 refills | Status: DC

## 2016-11-30 NOTE — Addendum Note (Signed)
Addended by: Elouise Munroe on: 11/30/2016 03:24 PM   Modules accepted: Orders

## 2016-11-30 NOTE — Patient Instructions (Signed)
1. No Pap smear done. Next Pap smear is due in 2020 2. Mammogram already obtained this year. 3. Stool guaiac cards are given for colon cancer screening 4. Screening labs are to be obtained through primary care 5. Continue with healthy eating, exercise, and controlled weight loss 6. Continue with calcium and vitamin D supplementation daily 7. Return in 1 year for annual exam   Health Maintenance for Postmenopausal Women Menopause is a normal process in which your reproductive ability comes to an end. This process happens gradually over a span of months to years, usually between the ages of 89 and 5. Menopause is complete when you have missed 12 consecutive menstrual periods. It is important to talk with your health care provider about some of the most common conditions that affect postmenopausal women, such as heart disease, cancer, and bone loss (osteoporosis). Adopting a healthy lifestyle and getting preventive care can help to promote your health and wellness. Those actions can also lower your chances of developing some of these common conditions. What should I know about menopause? During menopause, you may experience a number of symptoms, such as:  Moderate-to-severe hot flashes.  Night sweats.  Decrease in sex drive.  Mood swings.  Headaches.  Tiredness.  Irritability.  Memory problems.  Insomnia.  Choosing to treat or not to treat menopausal changes is an individual decision that you make with your health care provider. What should I know about hormone replacement therapy and supplements? Hormone therapy products are effective for treating symptoms that are associated with menopause, such as hot flashes and night sweats. Hormone replacement carries certain risks, especially as you become older. If you are thinking about using estrogen or estrogen with progestin treatments, discuss the benefits and risks with your health care provider. What should I know about heart disease  and stroke? Heart disease, heart attack, and stroke become more likely as you age. This may be due, in part, to the hormonal changes that your body experiences during menopause. These can affect how your body processes dietary fats, triglycerides, and cholesterol. Heart attack and stroke are both medical emergencies. There are many things that you can do to help prevent heart disease and stroke:  Have your blood pressure checked at least every 1-2 years. High blood pressure causes heart disease and increases the risk of stroke.  If you are 72-14 years old, ask your health care provider if you should take aspirin to prevent a heart attack or a stroke.  Do not use any tobacco products, including cigarettes, chewing tobacco, or electronic cigarettes. If you need help quitting, ask your health care provider.  It is important to eat a healthy diet and maintain a healthy weight. ? Be sure to include plenty of vegetables, fruits, low-fat dairy products, and lean protein. ? Avoid eating foods that are high in solid fats, added sugars, or salt (sodium).  Get regular exercise. This is one of the most important things that you can do for your health. ? Try to exercise for at least 150 minutes each week. The type of exercise that you do should increase your heart rate and make you sweat. This is known as moderate-intensity exercise. ? Try to do strengthening exercises at least twice each week. Do these in addition to the moderate-intensity exercise.  Know your numbers.Ask your health care provider to check your cholesterol and your blood glucose. Continue to have your blood tested as directed by your health care provider.  What should I know about cancer screening?  There are several types of cancer. Take the following steps to reduce your risk and to catch any cancer development as early as possible. Breast Cancer  Practice breast self-awareness. ? This means understanding how your breasts normally  appear and feel. ? It also means doing regular breast self-exams. Let your health care provider know about any changes, no matter how small.  If you are 70 or older, have a clinician do a breast exam (clinical breast exam or CBE) every year. Depending on your age, family history, and medical history, it may be recommended that you also have a yearly breast X-ray (mammogram).  If you have a family history of breast cancer, talk with your health care provider about genetic screening.  If you are at high risk for breast cancer, talk with your health care provider about having an MRI and a mammogram every year.  Breast cancer (BRCA) gene test is recommended for women who have family members with BRCA-related cancers. Results of the assessment will determine the need for genetic counseling and BRCA1 and for BRCA2 testing. BRCA-related cancers include these types: ? Breast. This occurs in males or females. ? Ovarian. ? Tubal. This may also be called fallopian tube cancer. ? Cancer of the abdominal or pelvic lining (peritoneal cancer). ? Prostate. ? Pancreatic.  Cervical, Uterine, and Ovarian Cancer Your health care provider may recommend that you be screened regularly for cancer of the pelvic organs. These include your ovaries, uterus, and vagina. This screening involves a pelvic exam, which includes checking for microscopic changes to the surface of your cervix (Pap test).  For women ages 21-65, health care providers may recommend a pelvic exam and a Pap test every three years. For women ages 52-65, they may recommend the Pap test and pelvic exam, combined with testing for human papilloma virus (HPV), every five years. Some types of HPV increase your risk of cervical cancer. Testing for HPV may also be done on women of any age who have unclear Pap test results.  Other health care providers may not recommend any screening for nonpregnant women who are considered low risk for pelvic cancer and have no  symptoms. Ask your health care provider if a screening pelvic exam is right for you.  If you have had past treatment for cervical cancer or a condition that could lead to cancer, you need Pap tests and screening for cancer for at least 20 years after your treatment. If Pap tests have been discontinued for you, your risk factors (such as having a new sexual partner) need to be reassessed to determine if you should start having screenings again. Some women have medical problems that increase the chance of getting cervical cancer. In these cases, your health care provider may recommend that you have screening and Pap tests more often.  If you have a family history of uterine cancer or ovarian cancer, talk with your health care provider about genetic screening.  If you have vaginal bleeding after reaching menopause, tell your health care provider.  There are currently no reliable tests available to screen for ovarian cancer.  Lung Cancer Lung cancer screening is recommended for adults 70-24 years old who are at high risk for lung cancer because of a history of smoking. A yearly low-dose CT scan of the lungs is recommended if you:  Currently smoke.  Have a history of at least 30 pack-years of smoking and you currently smoke or have quit within the past 15 years. A pack-year is smoking an  average of one pack of cigarettes per day for one year.  Yearly screening should:  Continue until it has been 15 years since you quit.  Stop if you develop a health problem that would prevent you from having lung cancer treatment.  Colorectal Cancer  This type of cancer can be detected and can often be prevented.  Routine colorectal cancer screening usually begins at age 68 and continues through age 63.  If you have risk factors for colon cancer, your health care provider may recommend that you be screened at an earlier age.  If you have a family history of colorectal cancer, talk with your health care  provider about genetic screening.  Your health care provider may also recommend using home test kits to check for hidden blood in your stool.  A small camera at the end of a tube can be used to examine your colon directly (sigmoidoscopy or colonoscopy). This is done to check for the earliest forms of colorectal cancer.  Direct examination of the colon should be repeated every 5-10 years until age 71. However, if early forms of precancerous polyps or small growths are found or if you have a family history or genetic risk for colorectal cancer, you may need to be screened more often.  Skin Cancer  Check your skin from head to toe regularly.  Monitor any moles. Be sure to tell your health care provider: ? About any new moles or changes in moles, especially if there is a change in a mole's shape or color. ? If you have a mole that is larger than the size of a pencil eraser.  If any of your family members has a history of skin cancer, especially at a young age, talk with your health care provider about genetic screening.  Always use sunscreen. Apply sunscreen liberally and repeatedly throughout the day.  Whenever you are outside, protect yourself by wearing long sleeves, pants, a wide-brimmed hat, and sunglasses.  What should I know about osteoporosis? Osteoporosis is a condition in which bone destruction happens more quickly than new bone creation. After menopause, you may be at an increased risk for osteoporosis. To help prevent osteoporosis or the bone fractures that can happen because of osteoporosis, the following is recommended:  If you are 57-97 years old, get at least 1,000 mg of calcium and at least 600 mg of vitamin D per day.  If you are older than age 43 but younger than age 47, get at least 1,200 mg of calcium and at least 600 mg of vitamin D per day.  If you are older than age 57, get at least 1,200 mg of calcium and at least 800 mg of vitamin D per day.  Smoking and excessive  alcohol intake increase the risk of osteoporosis. Eat foods that are rich in calcium and vitamin D, and do weight-bearing exercises several times each week as directed by your health care provider. What should I know about how menopause affects my mental health? Depression may occur at any age, but it is more common as you become older. Common symptoms of depression include:  Low or sad mood.  Changes in sleep patterns.  Changes in appetite or eating patterns.  Feeling an overall lack of motivation or enjoyment of activities that you previously enjoyed.  Frequent crying spells.  Talk with your health care provider if you think that you are experiencing depression. What should I know about immunizations? It is important that you get and maintain your immunizations.  These include:  Tetanus, diphtheria, and pertussis (Tdap) booster vaccine.  Influenza every year before the flu season begins.  Pneumonia vaccine.  Shingles vaccine.  Your health care provider may also recommend other immunizations. This information is not intended to replace advice given to you by your health care provider. Make sure you discuss any questions you have with your health care provider. Document Released: 04/08/2005 Document Revised: 09/04/2015 Document Reviewed: 11/18/2014 Elsevier Interactive Patient Education  2018 [employer].

## 2017-08-28 ENCOUNTER — Other Ambulatory Visit: Payer: Self-pay | Admitting: Internal Medicine

## 2017-08-28 DIAGNOSIS — Z1231 Encounter for screening mammogram for malignant neoplasm of breast: Secondary | ICD-10-CM

## 2017-09-19 ENCOUNTER — Ambulatory Visit
Admission: RE | Admit: 2017-09-19 | Discharge: 2017-09-19 | Disposition: A | Discharge status: Home / Self Care | Payer: 59 | Source: Ambulatory Visit | Attending: Internal Medicine | Admitting: Internal Medicine

## 2017-09-19 DIAGNOSIS — Z1231 Encounter for screening mammogram for malignant neoplasm of breast: Secondary | ICD-10-CM | POA: Insufficient documentation

## 2017-12-03 NOTE — Progress Notes (Signed)
ANNUAL PREVENTATIVE CARE GYN  ENCOUNTER NOTE  Subjective:       Kelsey Holt is a 57 y.o. No obstetric history on file. female here for a routine annual gynecologic exam.  Current complaints:  1. D/c estrace d/t nipple d/c; patient discontinued her estradiol 1 mg daily because of the bilateral nipple discharge; this is the second time she has discontinued her medication because of the nipple discharge.  2. Pos for hot flashes and nite sweats; she has desired to just manage her symptoms without hormones. 3.  Difficulty with managing weight due to working 12-hour days, traveling 1 to 2 hours a day for work, and chronic stress and fatigue impairing her desire to proceed with lifestyle changes.  Colonoscopy 04/2015 was normal. Mammogram is already obtained. Bowel function is normal using 1 teaspoon MiraLAX daily Stress urinary incontinence is ongoing, stable; patient wears pads prophylactically  Gynecologic History No LMP recorded. Patient has had a hysterectomy. Contraception: status post hysterectomy Last Pap:10/2015 neg/neg  Last mammogram: 08/2017  birad 1  Obstetric History OB History  Gravida Para Term Preterm AB Living  4 3 3   1 3   SAB TAB Ectopic Multiple Live Births  1       3    # Outcome Date GA Lbr Len/2nd Weight Sex Delivery Anes PTL Lv  4 Term 1987   8 lb 2.2 oz (3.692 kg) F Vag-Spont   LIV  3 Term 1985   7 lb 2.1 oz (3.234 kg) F Vag-Spont   LIV  2 SAB 1984          1 Term 1977   7 lb 2.2 oz (3.239 kg) F Vag-Spont   LIV    Past Medical History:  Diagnosis Date  . Arthritis   . History of heavy periods   . Hypertension    borderline  . Menopause   . S/P vaginal hysterectomy    d/t menorroghia  . Seasonal allergies   . SUI (stress urinary incontinence, female)   . Vaginal atrophy   . Wears glasses     Past Surgical History:  Procedure Laterality Date  . ABDOMINAL HYSTERECTOMY    . COLONOSCOPY WITH PROPOFOL N/A 05/01/2015   Procedure: COLONOSCOPY WITH  PROPOFOL;  Surgeon: Josefine Class, MD;  Location: [facility] ENDOSCOPY;  Service: Endoscopy;  Laterality: N/A;  . DILATION AND CURETTAGE OF UTERUS    . FOOT ARTHROTOMY  2010   bilateral toes repaired  . 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 PARTIAL HYSTERECTOMY    . TUBAL LIGATION      Current Outpatient Medications on File Prior to Visit  Medication Sig Dispense Refill  . estradiol (ESTRACE) 1 MG tablet Take 1 tablet (1 mg total) by mouth daily. 90 tablet 3  . levothyroxine (SYNTHROID, LEVOTHROID) 50 MCG tablet Take by mouth.    . polyethylene glycol (MIRALAX / GLYCOLAX) packet Take 17 g by mouth daily.     No current facility-administered medications on file prior to visit.     Allergies  Allergen Reactions  . Penicillins Hives    Social History   Socioeconomic History  . Marital status: Married    Spouse name: Not on file  . Number of children: Not on file  . Years of education:  Not on file  . Highest education level: Not on file  Occupational History  . Not on file  Social Needs  . Financial resource strain: Not on file  . Food insecurity:    Worry: Not on file    Inability: Not on file  . Transportation needs:    Medical: Not on file    Non-medical: Not on file  Tobacco Use  . Smoking status: Former Smoker    Last attempt to quit: 06/25/2012    Years since quitting: 5.4  . Smokeless tobacco: Former Network engineer and Sexual Activity  . Alcohol use: Yes    Comment: occ  . Drug use: No  . Sexual activity: Yes    Birth control/protection: Surgical  Lifestyle  . Physical activity:    Days per week: Not on file    Minutes per session: Not on file  . Stress: Not on file  Relationships  . Social connections:     Talks on phone: Not on file    Gets together: Not on file    Attends religious service: Not on file    Active member of club or organization: Not on file    Attends meetings of clubs or organizations: Not on file    Relationship status: Not on file  . Intimate partner violence:    Fear of current or ex partner: Not on file    Emotionally abused: Not on file    Physically abused: Not on file    Forced sexual activity: Not on file  Other Topics Concern  . Not on file  Social History Narrative  . Not on file    Family History  Problem Relation Age of Onset  . Breast cancer Neg Hx   . Ovarian cancer Neg Hx   . Colon cancer Neg Hx   . Diabetes Neg Hx   . Heart disease Neg Hx     The following portions of the patient's history were reviewed and updated as appropriate: allergies, current medications, past family history, past medical history, past social history, past surgical history and problem list.  Review of Systems Review of Systems  Constitutional:       Vasomotor symptoms-moderate; after discontinuation of Estrace 1 mg daily  HENT: Negative.   Eyes: Negative.   Respiratory: Negative.   Cardiovascular: Negative.   Gastrointestinal: Negative.   Genitourinary:       Stress incontinence, mild, wears pads  Musculoskeletal: Negative.   Skin: Negative.   Neurological: Negative.   Endo/Heme/Allergies: Negative.   Psychiatric/Behavioral: Negative.     Objective:   BP (!) 145/78   Pulse 71   Ht 5\' 7"  (1.702 m)   Wt 200 lb (90.7 kg)   BMI 31.32 kg/m  CONSTITUTIONAL: Well-developed, well-nourished female in no acute distress.  PSYCHIATRIC: Normal mood and affect. Normal behavior. Normal judgment and thought content. [place]: Alert and oriented to person, place, and time. Normal muscle tone coordination. No cranial nerve deficit noted. HENT:  Normocephalic, atraumatic, External right and left ear normal.  EYES: Conjunctivae and EOM are normal. No scleral icterus.   NECK: Normal range of motion, supple, no masses.  Normal thyroid.  SKIN: Skin is warm and dry. No rash noted. Not diaphoretic. No erythema. No pallor. CARDIOVASCULAR: Normal heart rate noted, regular rhythm, no murmur. RESPIRATORY: Clear to auscultation bilaterally. Effort and breath sounds normal, no problems with respiration noted. BREASTS: Symmetric in size. No masses, skin changes, nipple drainage, or lymphadenopathy. ABDOMEN: Soft, no distention noted.  No tenderness, rebound or guarding.  BLADDER: Normal PELVIC:  External Genitalia: Normal  BUS: Normal  Vagina: Moderate atrophy moderate good vault support  Cervix: Surgically absent  Uterus: Surgically absent  Adnexa: Normal  RV: Hemorrhoids, external, No Rectal Masses and Normal Sphincter tone  MUSCULOSKELETAL: Normal range of motion. No tenderness.  No cyanosis, clubbing, or edema.  2+ distal pulses. LYMPHATIC: No Axillary, Supraclavicular, or Inguinal Adenopathy.    Assessment:   Annual gynecologic examination 57 y.o. 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  I have reviewed, updated, and concur with the documentation provided by Joyice Faster, CMA Brayton Mars, MD  Note: This dictation was prepared with Dragon dictation along with smaller phrase technology. Any transcriptional errors that result from this process are unintentional.

## 2017-12-05 ENCOUNTER — Ambulatory Visit (INDEPENDENT_AMBULATORY_CARE_PROVIDER_SITE_OTHER): Payer: 59 | Admitting: Obstetrics and Gynecology

## 2017-12-05 ENCOUNTER — Encounter: Payer: Self-pay | Admitting: Obstetrics and Gynecology

## 2017-12-05 VITALS — BP 145/78 | HR 71 | Ht 67.0 in | Wt 200.0 lb

## 2017-12-05 DIAGNOSIS — Z1211 Encounter for screening for malignant neoplasm of colon: Secondary | ICD-10-CM

## 2017-12-05 DIAGNOSIS — N393 Stress incontinence (female) (male): Secondary | ICD-10-CM

## 2017-12-05 DIAGNOSIS — E669 Obesity, unspecified: Secondary | ICD-10-CM | POA: Diagnosis not present

## 2017-12-05 DIAGNOSIS — Z01419 Encounter for gynecological examination (general) (routine) without abnormal findings: Secondary | ICD-10-CM

## 2017-12-05 DIAGNOSIS — N952 Postmenopausal atrophic vaginitis: Secondary | ICD-10-CM | POA: Diagnosis not present

## 2017-12-05 DIAGNOSIS — Z90710 Acquired absence of both cervix and uterus: Secondary | ICD-10-CM | POA: Diagnosis not present

## 2017-12-05 DIAGNOSIS — Z780 Asymptomatic menopausal state: Secondary | ICD-10-CM

## 2017-12-05 MED ORDER — CLONIDINE 0.1 MG/24HR TD PTWK: 0.1000 mg | MEDICATED_PATCH | TRANSDERMAL | 12 refills | Status: DC

## 2017-12-05 MED ORDER — ESTROGENS, CONJUGATED 0.625 MG/GM VA CREA: 0.2500 | TOPICAL_CREAM | VAGINAL | 3 refills | Status: DC

## 2017-12-05 NOTE — Patient Instructions (Addendum)
1.  Pap smear is not done.  Next Pap smear is due 2020. 2.  Mammogram is up-to-date. 3.  Stool guaiac card testing is given for colon cancer screening 4.  Screening labs are to be obtained today. 5.  Continue with healthy eating, and exercise. 6.  Continue with calcium and vitamin D supplementation daily 7.  Estradiol 1 mg is not refilled due to nipple discharge side effect (systemic estrogen) 8.  Return in 1 year for annual exam-Dr. Amalia Hailey 9.  Begin trial of Premarin cream 1/2 g intravaginal twice weekly 10.  Begin trial of clonidine patch 0.1 mg weekly patch for vasomotor symptom management. Health Maintenance for Postmenopausal Women Menopause is a normal process in which your reproductive ability comes to an end. This process happens gradually over a span of months to years, usually between the ages of 47 and 39. Menopause is complete when you have missed 12 consecutive menstrual periods. It is important to talk with your health care provider about some of the most common conditions that affect postmenopausal women, such as heart disease, cancer, and bone loss (osteoporosis). Adopting a healthy lifestyle and getting preventive care can help to promote your health and wellness. Those actions can also lower your chances of developing some of these common conditions. What should I know about menopause? During menopause, you may experience a number of symptoms, such as:  Moderate-to-severe hot flashes.  Night sweats.  Decrease in sex drive.  Mood swings.  Headaches.  Tiredness.  Irritability.  Memory problems.  Insomnia.  Choosing to treat or not to treat menopausal changes is an individual decision that you make with your health care provider. What should I know about hormone replacement therapy and supplements? Hormone therapy products are effective for treating symptoms that are associated with menopause, such as hot flashes and night sweats. Hormone replacement carries certain  risks, especially as you become older. If you are thinking about using estrogen or estrogen with progestin treatments, discuss the benefits and risks with your health care provider. What should I know about heart disease and stroke? Heart disease, heart attack, and stroke become more likely as you age. This may be due, in part, to the hormonal changes that your body experiences during menopause. These can affect how your body processes dietary fats, triglycerides, and cholesterol. Heart attack and stroke are both medical emergencies. There are many things that you can do to help prevent heart disease and stroke:  Have your blood pressure checked at least every 1-2 years. High blood pressure causes heart disease and increases the risk of stroke.  If you are 38-59 years old, ask your health care provider if you should take aspirin to prevent a heart attack or a stroke.  Do not use any tobacco products, including cigarettes, chewing tobacco, or electronic cigarettes. If you need help quitting, ask your health care provider.  It is important to eat a healthy diet and maintain a healthy weight. ? Be sure to include plenty of vegetables, fruits, low-fat dairy products, and lean protein. ? Avoid eating foods that are high in solid fats, added sugars, or salt (sodium).  Get regular exercise. This is one of the most important things that you can do for your health. ? Try to exercise for at least 150 minutes each week. The type of exercise that you do should increase your heart rate and make you sweat. This is known as moderate-intensity exercise. ? Try to do strengthening exercises at least twice each week. Do  these in addition to the moderate-intensity exercise.  Know your numbers.Ask your health care provider to check your cholesterol and your blood glucose. Continue to have your blood tested as directed by your health care provider.  What should I know about cancer screening? There are several types  of cancer. Take the following steps to reduce your risk and to catch any cancer development as early as possible. Breast Cancer  Practice breast self-awareness. ? This means understanding how your breasts normally appear and feel. ? It also means doing regular breast self-exams. Let your health care provider know about any changes, no matter how small.  If you are 68 or older, have a clinician do a breast exam (clinical breast exam or CBE) every year. Depending on your age, family history, and medical history, it may be recommended that you also have a yearly breast X-ray (mammogram).  If you have a family history of breast cancer, talk with your health care provider about genetic screening.  If you are at high risk for breast cancer, talk with your health care provider about having an MRI and a mammogram every year.  Breast cancer (BRCA) gene test is recommended for women who have family members with BRCA-related cancers. Results of the assessment will determine the need for genetic counseling and BRCA1 and for BRCA2 testing. BRCA-related cancers include these types: ? Breast. This occurs in males or females. ? Ovarian. ? Tubal. This may also be called fallopian tube cancer. ? Cancer of the abdominal or pelvic lining (peritoneal cancer). ? Prostate. ? Pancreatic.  Cervical, Uterine, and Ovarian Cancer Your health care provider may recommend that you be screened regularly for cancer of the pelvic organs. These include your ovaries, uterus, and vagina. This screening involves a pelvic exam, which includes checking for microscopic changes to the surface of your cervix (Pap test).  For women ages 21-65, health care providers may recommend a pelvic exam and a Pap test every three years. For women ages 49-65, they may recommend the Pap test and pelvic exam, combined with testing for human papilloma virus (HPV), every five years. Some types of HPV increase your risk of cervical cancer. Testing for  HPV may also be done on women of any age who have unclear Pap test results.  Other health care providers may not recommend any screening for nonpregnant women who are considered low risk for pelvic cancer and have no symptoms. Ask your health care provider if a screening pelvic exam is right for you.  If you have had past treatment for cervical cancer or a condition that could lead to cancer, you need Pap tests and screening for cancer for at least 20 years after your treatment. If Pap tests have been discontinued for you, your risk factors (such as having a new sexual partner) need to be reassessed to determine if you should start having screenings again. Some women have medical problems that increase the chance of getting cervical cancer. In these cases, your health care provider may recommend that you have screening and Pap tests more often.  If you have a family history of uterine cancer or ovarian cancer, talk with your health care provider about genetic screening.  If you have vaginal bleeding after reaching menopause, tell your health care provider.  There are currently no reliable tests available to screen for ovarian cancer.  Lung Cancer Lung cancer screening is recommended for adults 41-46 years old who are at high risk for lung cancer because of a history of  smoking. A yearly low-dose CT scan of the lungs is recommended if you:  Currently smoke.  Have a history of at least 30 pack-years of smoking and you currently smoke or have quit within the past 15 years. A pack-year is smoking an average of one pack of cigarettes per day for one year.  Yearly screening should:  Continue until it has been 15 years since you quit.  Stop if you develop a health problem that would prevent you from having lung cancer treatment.  Colorectal Cancer  This type of cancer can be detected and can often be prevented.  Routine colorectal cancer screening usually begins at age 73 and continues through  age 42.  If you have risk factors for colon cancer, your health care provider may recommend that you be screened at an earlier age.  If you have a family history of colorectal cancer, talk with your health care provider about genetic screening.  Your health care provider may also recommend using home test kits to check for hidden blood in your stool.  A small camera at the end of a tube can be used to examine your colon directly (sigmoidoscopy or colonoscopy). This is done to check for the earliest forms of colorectal cancer.  Direct examination of the colon should be repeated every 5-10 years until age 51. However, if early forms of precancerous polyps or small growths are found or if you have a family history or genetic risk for colorectal cancer, you may need to be screened more often.  Skin Cancer  Check your skin from head to toe regularly.  Monitor any moles. Be sure to tell your health care provider: ? About any new moles or changes in moles, especially if there is a change in a mole's shape or color. ? If you have a mole that is larger than the size of a pencil eraser.  If any of your family members has a history of skin cancer, especially at a young age, talk with your health care provider about genetic screening.  Always use sunscreen. Apply sunscreen liberally and repeatedly throughout the day.  Whenever you are outside, protect yourself by wearing long sleeves, pants, a wide-brimmed hat, and sunglasses.  What should I know about osteoporosis? Osteoporosis is a condition in which bone destruction happens more quickly than new bone creation. After menopause, you may be at an increased risk for osteoporosis. To help prevent osteoporosis or the bone fractures that can happen because of osteoporosis, the following is recommended:  If you are 61-32 years old, get at least 1,000 mg of calcium and at least 600 mg of vitamin D per day.  If you are older than age 48 but younger than  age 21, get at least 1,200 mg of calcium and at least 600 mg of vitamin D per day.  If you are older than age 83, get at least 1,200 mg of calcium and at least 800 mg of vitamin D per day.  Smoking and excessive alcohol intake increase the risk of osteoporosis. Eat foods that are rich in calcium and vitamin D, and do weight-bearing exercises several times each week as directed by your health care provider. What should I know about how menopause affects my mental health? Depression may occur at any age, but it is more common as you become older. Common symptoms of depression include:  Low or sad mood.  Changes in sleep patterns.  Changes in appetite or eating patterns.  Feeling an overall lack of  motivation or enjoyment of activities that you previously enjoyed.  Frequent crying spells.  Talk with your health care provider if you think that you are experiencing depression. What should I know about immunizations? It is important that you get and maintain your immunizations. These include:  Tetanus, diphtheria, and pertussis (Tdap) booster vaccine.  Influenza every year before the flu season begins.  Pneumonia vaccine.  Shingles vaccine.  Your health care provider may also recommend other immunizations. This information is not intended to replace advice given to you by your health care provider. Make sure you discuss any questions you have with your health care provider. Document Released: 04/08/2005 Document Revised: 09/04/2015 Document Reviewed: 11/18/2014 Elsevier Interactive Patient Education  2018 [employer].

## 2017-12-06 LAB — CBC WITH DIFFERENTIAL/PLATELET
Basophils Absolute: 0 10*3/uL (ref 0.0–0.2)
Basos: 0 %
EOS (ABSOLUTE): 0.1 10*3/uL (ref 0.0–0.4)
Eos: 2 %
Hematocrit: 42.4 % (ref 34.0–46.6)
Hemoglobin: 14.2 g/dL (ref 11.1–15.9)
Immature Grans (Abs): 0 10*3/uL (ref 0.0–0.1)
Immature Granulocytes: 0 %
Lymphocytes Absolute: 2.1 10*3/uL (ref 0.7–3.1)
Lymphs: 42 %
MCH: 29 pg (ref 26.6–33.0)
MCHC: 33.5 g/dL (ref 31.5–35.7)
MCV: 87 fL (ref 79–97)
Monocytes Absolute: 0.3 10*3/uL (ref 0.1–0.9)
Monocytes: 6 %
Neutrophils Absolute: 2.5 10*3/uL (ref 1.4–7.0)
Neutrophils: 50 %
Platelets: 186 10*3/uL (ref 150–450)
RBC: 4.89 x10E6/uL (ref 3.77–5.28)
RDW: 12.5 % (ref 12.3–15.4)
WBC: 5 10*3/uL (ref 3.4–10.8)

## 2017-12-06 LAB — LIPID PANEL
Chol/HDL Ratio: 4.6 ratio — ABNORMAL HIGH (ref 0.0–4.4)
Cholesterol, Total: 237 mg/dL — ABNORMAL HIGH (ref 100–199)
HDL: 52 mg/dL (ref >39)
LDL Calculated: 167 mg/dL — ABNORMAL HIGH (ref 0–99)
Triglycerides: 90 mg/dL (ref 0–149)
VLDL Cholesterol Cal: 18 mg/dL (ref 5–40)

## 2017-12-06 LAB — HEMOGLOBIN A1C
Est. average glucose Bld gHb Est-mCnc: 120 mg/dL
Hgb A1c MFr Bld: 5.8 % — ABNORMAL HIGH (ref 4.8–5.6)

## 2017-12-06 LAB — GLUCOSE, RANDOM: Glucose: 86 mg/dL (ref 65–99)

## 2017-12-06 LAB — TSH: TSH: 7.36 u[IU]/mL — ABNORMAL HIGH (ref 0.450–4.500)

## 2017-12-07 ENCOUNTER — Other Ambulatory Visit: Payer: Self-pay

## 2017-12-07 DIAGNOSIS — E785 Hyperlipidemia, unspecified: Secondary | ICD-10-CM

## 2017-12-07 DIAGNOSIS — R7309 Other abnormal glucose: Secondary | ICD-10-CM

## 2017-12-07 DIAGNOSIS — R7989 Other specified abnormal findings of blood chemistry: Secondary | ICD-10-CM

## 2017-12-12 ENCOUNTER — Other Ambulatory Visit: Payer: 59

## 2017-12-12 DIAGNOSIS — R7989 Other specified abnormal findings of blood chemistry: Secondary | ICD-10-CM

## 2017-12-13 LAB — THYROID PANEL WITH TSH
Free Thyroxine Index: 1.7 (ref 1.2–4.9)
T3 Uptake Ratio: 25 % (ref 24–39)
T4, Total: 6.8 ug/dL (ref 4.5–12.0)
TSH: 4.4 u[IU]/mL (ref 0.450–4.500)

## 2018-05-16 DIAGNOSIS — E7800 Pure hypercholesterolemia, unspecified: Secondary | ICD-10-CM | POA: Insufficient documentation

## 2018-06-12 ENCOUNTER — Other Ambulatory Visit: Payer: 59

## 2018-08-14 DIAGNOSIS — E039 Hypothyroidism, unspecified: Secondary | ICD-10-CM | POA: Insufficient documentation

## 2018-10-11 ENCOUNTER — Other Ambulatory Visit: Payer: Self-pay | Admitting: Internal Medicine

## 2018-10-11 DIAGNOSIS — Z1231 Encounter for screening mammogram for malignant neoplasm of breast: Secondary | ICD-10-CM

## 2018-11-13 ENCOUNTER — Ambulatory Visit
Admission: RE | Admit: 2018-11-13 | Discharge: 2018-11-13 | Disposition: A | Discharge status: Home / Self Care | Payer: Managed Care, Other (non HMO) | Source: Ambulatory Visit | Attending: Internal Medicine | Admitting: Internal Medicine

## 2018-11-13 DIAGNOSIS — Z1231 Encounter for screening mammogram for malignant neoplasm of breast: Secondary | ICD-10-CM

## 2018-12-12 ENCOUNTER — Encounter: Payer: 59 | Admitting: Obstetrics and Gynecology

## 2019-10-09 DIAGNOSIS — I10 Essential (primary) hypertension: Secondary | ICD-10-CM | POA: Insufficient documentation

## 2019-10-10 ENCOUNTER — Other Ambulatory Visit: Payer: Self-pay | Admitting: Internal Medicine

## 2019-10-10 DIAGNOSIS — Z1231 Encounter for screening mammogram for malignant neoplasm of breast: Secondary | ICD-10-CM

## 2019-12-23 ENCOUNTER — Ambulatory Visit
Admission: RE | Admit: 2019-12-23 | Discharge: 2019-12-23 | Disposition: A | Discharge status: Home / Self Care | Payer: 59 | Source: Ambulatory Visit | Attending: Internal Medicine | Admitting: Internal Medicine

## 2019-12-23 ENCOUNTER — Other Ambulatory Visit: Payer: Self-pay

## 2019-12-23 DIAGNOSIS — Z1231 Encounter for screening mammogram for malignant neoplasm of breast: Secondary | ICD-10-CM | POA: Insufficient documentation

## 2021-02-11 ENCOUNTER — Other Ambulatory Visit: Payer: Self-pay | Admitting: Internal Medicine

## 2021-02-11 DIAGNOSIS — Z1231 Encounter for screening mammogram for malignant neoplasm of breast: Secondary | ICD-10-CM

## 2021-04-13 ENCOUNTER — Ambulatory Visit
Admission: RE | Admit: 2021-04-13 | Discharge: 2021-04-13 | Disposition: A | Discharge status: Home / Self Care | Payer: 59 | Source: Ambulatory Visit | Attending: Internal Medicine | Admitting: Internal Medicine

## 2021-04-13 ENCOUNTER — Other Ambulatory Visit: Payer: Self-pay

## 2021-04-13 DIAGNOSIS — Z1231 Encounter for screening mammogram for malignant neoplasm of breast: Secondary | ICD-10-CM | POA: Diagnosis not present

## 2021-07-21 ENCOUNTER — Ambulatory Visit (INDEPENDENT_AMBULATORY_CARE_PROVIDER_SITE_OTHER): Payer: 59

## 2021-07-21 ENCOUNTER — Encounter: Payer: Self-pay | Admitting: Podiatry

## 2021-07-21 ENCOUNTER — Ambulatory Visit (INDEPENDENT_AMBULATORY_CARE_PROVIDER_SITE_OTHER): Payer: 59 | Admitting: Podiatry

## 2021-07-21 DIAGNOSIS — M778 Other enthesopathies, not elsewhere classified: Secondary | ICD-10-CM

## 2021-07-21 DIAGNOSIS — M7540 Impingement syndrome of unspecified shoulder: Secondary | ICD-10-CM | POA: Insufficient documentation

## 2021-07-21 DIAGNOSIS — M19079 Primary osteoarthritis, unspecified ankle and foot: Secondary | ICD-10-CM

## 2021-07-21 MED ORDER — MELOXICAM 15 MG PO TABS: 15.0000 mg | ORAL_TABLET | Freq: Every day | ORAL | 3 refills | Status: DC

## 2021-07-21 NOTE — Progress Notes (Addendum)
Subjective:  Patient ID: Kelsey Holt, female    DOB: 05-09-1960,  MRN: [id number] HPI Chief Complaint  Patient presents with   Foot Pain    Plantar forefoot bilateral (L>R) - aching x several years, did surgery 10 years ago for HT, can't walk without pain or even barefoot, tried tylenol or advil   New Patient (Initial Visit)    Est pt 10 years+    61 y.o. female presents with the above complaint.   ROS: Denies fever chills nausea vomiting muscle aches pains calf pain back pain chest pain shortness of breath  Past Medical History:  Diagnosis Date   Arthritis    History of heavy periods    Hypertension    borderline   Menopause    S/P vaginal hysterectomy    d/t menorroghia   Seasonal allergies    SUI (stress urinary incontinence, female)    Vaginal atrophy    Wears glasses    Past Surgical History:  Procedure Laterality Date   ABDOMINAL HYSTERECTOMY     COLONOSCOPY WITH PROPOFOL N/A 05/01/2015   Procedure: COLONOSCOPY WITH PROPOFOL;  Surgeon: Josefine Class, MD;  Location: [facility] ENDOSCOPY;  Service: Endoscopy;  Laterality: N/A;   DILATION AND CURETTAGE OF UTERUS     FOOT ARTHROTOMY  2010   bilateral toes repaired   KNEE ARTHROSCOPY WITH MEDIAL MENISECTOMY Left 06/30/2014   Procedure: KNEE ARTHROSCOPY WITH MEDIAL MENISECTOMY;  Surgeon: Frederik Pear, MD;  Location: [facility];  Service: Orthopedics;  Laterality: Left;  chondromalatia loose bodly removal partial lateral and medial menisectomy   PARTIAL HYSTERECTOMY     TUBAL LIGATION      Current Outpatient Medications:    meloxicam (MOBIC) 15 MG tablet, Take 1 tablet (15 mg total) by mouth daily., Disp: 30 tablet, Rfl: 3   levocetirizine (XYZAL) 5 MG tablet, SMARTSIG:1 Tablet(s) By Mouth Every Evening, Disp: , Rfl:    levothyroxine (SYNTHROID) 50 MCG tablet, Take 50 mcg by mouth every morning., Disp: , Rfl:    losartan-hydrochlorothiazide (HYZAAR) 100-12.5 MG tablet, Take 1 tablet by mouth daily.,  Disp: , Rfl:    polyethylene glycol (MIRALAX / GLYCOLAX) packet, Take 17 g by mouth daily., Disp: , Rfl:    rosuvastatin (CRESTOR) 10 MG tablet, Take 10 mg by mouth daily., Disp: , Rfl:   Allergies  Allergen Reactions   Penicillins Hives   Review of Systems Objective:  There were no vitals filed for this visit.  General: Well developed, nourished, in no acute distress, alert and oriented x3   Dermatological: Skin is warm, dry and supple bilateral. Nails x 10 are well maintained; remaining integument appears unremarkable at this time. There are no open sores, no preulcerative lesions, no rash or signs of infection present.  Vascular: Dorsalis Pedis artery and Posterior Tibial artery pedal pulses are 2/4 bilateral with immedate capillary fill time. Pedal hair growth present. No varicosities and no lower extremity edema present bilateral.   Neruologic: Grossly intact via light touch bilateral. Vibratory intact via tuning fork bilateral. Protective threshold with Semmes Wienstein monofilament intact to all pedal sites bilateral. Patellar and Achilles deep tendon reflexes 2+ bilateral. No Babinski or clonus noted bilateral.   Musculoskeletal: No gross boney pedal deformities bilateral. No pain, crepitus, or limitation noted with foot and ankle range of motion bilateral. Muscular strength 5/5 in all groups tested bilateral.  She still has significant restriction of range of motion and is considerable swelling to the bilateral lower extremities.  She has  severe osteoarthritic crepitus on attempted range of motion particularly of the hallux bilaterally.  The lesser digits move freely at the metatarsal phalangeal joints though edema is mildly restrictive.  Gait: Unassisted, Nonantalgic.    Radiographs:  Radiographs taken today demonstrate osseously mature individual.  She has severe osteoarthritis of the first metatarsophalangeal joints and second and third metatarsal phalangeal joints mildly with  retention of second third metatarsal phalangeal joints with osteotomies as well as the first metatarsophalangeal joint.  It appears that she had shortening osteotomies of the first metatarsals and an attempt to head off osteoarthritic change to prevent a total joint replacement at the time.  She has continued to progress with her osteoarthritis.    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  Plan: Discussed etiology pathology conservative surgical therapies at this point I would not recommend surgical intervention.  We did discuss that her only option is primarily would be removal of the internal fixation as well as a replacement of the first metatarsal phalangeal joint but we would risk more edema and more pain with that.  We also did discuss the possible surgery such as a fusion but neither she nor her daughter like that option.  I think the best thing for her at this point would be orthotics to help offload the forefoot.  They need to be relatively soft in the arch but we need to have a step off right at the metatarsal heads.  We need to take pressure off of all of the metatarsals.  We are going to continue her meloxicam as well.  Due to this limiting her ability to perform her daily activities and enjoy her family life and exercise to prevent further health issues most likely this arthritis would be significant enough to warrant disability.     Kelsey Holt T. [place], [place]

## 2021-07-22 ENCOUNTER — Ambulatory Visit (INDEPENDENT_AMBULATORY_CARE_PROVIDER_SITE_OTHER): Payer: 59

## 2021-07-22 DIAGNOSIS — M778 Other enthesopathies, not elsewhere classified: Secondary | ICD-10-CM

## 2021-07-22 NOTE — Progress Notes (Signed)
SITUATION Reason for Consult: Evaluation for Bilateral Custom Foot Orthoses Patient / Caregiver Report: Patient is ready for foot orthotics  OBJECTIVE DATA: Patient History / Diagnosis:    ICD-10-CM   1. Capsulitis of foot, unspecified laterality  M77.8       Current or Previous Devices:   Historical user  Foot Examination: Skin presentation:   Intact Ulcers & Callousing:   None Toe / Foot Deformities:  None Weight Bearing Presentation:  Rectus Sensation:    Intact  Shoe Size:    9M  ORTHOTIC RECOMMENDATION Recommended Device: 1x pair of custom functional foot orthotics  GOALS OF ORTHOSES - Reduce Pain - Prevent Foot Deformity - Prevent Progression of Further Foot Deformity - Relieve Pressure - Improve the Overall Biomechanical Function of the Foot and Lower Extremity.  ACTIONS PERFORMED Potential out of pocket cost was communicated to patient. Patient understood and consent to casting. Patient was casted for Foot Orthoses via crush box. Procedure was explained and patient tolerated procedure well. Casts were shipped to central fabrication. All questions were answered and concerns addressed.  PLAN Patient is to be called for fitting when devices are ready.

## 2021-09-02 ENCOUNTER — Ambulatory Visit (INDEPENDENT_AMBULATORY_CARE_PROVIDER_SITE_OTHER): Payer: 59

## 2021-09-02 DIAGNOSIS — M778 Other enthesopathies, not elsewhere classified: Secondary | ICD-10-CM

## 2021-09-02 NOTE — Progress Notes (Signed)
Patient presents today to pick up custom molded foot orthotics, diagnosed with capsulitis by Dr. Milinda Pointer.   Orthotics were dispensed and fit was satisfactory. Reviewed instructions for break-in and wear. Written instructions given to patient.  Patient will follow up as needed.   [facility] - order # [id number]

## 2021-11-16 ENCOUNTER — Other Ambulatory Visit: Payer: Self-pay | Admitting: Podiatry

## 2022-02-10 ENCOUNTER — Ambulatory Visit: Admit: 2022-02-10 | Payer: 59

## 2022-02-10 SURGERY — COLONOSCOPY WITH PROPOFOL
Anesthesia: General

## 2022-02-11 ENCOUNTER — Other Ambulatory Visit: Payer: Self-pay | Admitting: Internal Medicine

## 2022-02-11 DIAGNOSIS — Z1231 Encounter for screening mammogram for malignant neoplasm of breast: Secondary | ICD-10-CM

## 2022-04-22 ENCOUNTER — Ambulatory Visit
Admission: RE | Admit: 2022-04-22 | Discharge: 2022-04-22 | Disposition: A | Discharge status: Home / Self Care | Payer: Managed Care, Other (non HMO) | Source: Ambulatory Visit | Attending: Internal Medicine | Admitting: Internal Medicine

## 2022-04-22 DIAGNOSIS — Z1231 Encounter for screening mammogram for malignant neoplasm of breast: Secondary | ICD-10-CM | POA: Insufficient documentation

## 2022-04-22 DIAGNOSIS — M0579 Rheumatoid arthritis with rheumatoid factor of multiple sites without organ or systems involvement: Secondary | ICD-10-CM | POA: Insufficient documentation

## 2023-03-23 ENCOUNTER — Other Ambulatory Visit: Payer: Self-pay | Admitting: Internal Medicine

## 2023-03-23 DIAGNOSIS — Z1231 Encounter for screening mammogram for malignant neoplasm of breast: Secondary | ICD-10-CM

## 2023-04-28 ENCOUNTER — Ambulatory Visit
Admission: RE | Admit: 2023-04-28 | Discharge: 2023-04-28 | Disposition: A | Discharge status: Home / Self Care | Payer: 59 | Source: Ambulatory Visit | Attending: Internal Medicine | Admitting: Internal Medicine

## 2023-04-28 DIAGNOSIS — Z1231 Encounter for screening mammogram for malignant neoplasm of breast: Secondary | ICD-10-CM | POA: Diagnosis present

## 2023-05-30 DIAGNOSIS — R7303 Prediabetes: Secondary | ICD-10-CM | POA: Insufficient documentation

## 2023-08-21 ENCOUNTER — Other Ambulatory Visit: Payer: Self-pay | Admitting: Family Medicine

## 2023-08-21 DIAGNOSIS — M5412 Radiculopathy, cervical region: Secondary | ICD-10-CM

## 2023-08-22 ENCOUNTER — Encounter: Payer: Self-pay | Admitting: Family Medicine

## 2023-08-28 ENCOUNTER — Ambulatory Visit
Admission: RE | Admit: 2023-08-28 | Discharge: 2023-08-28 | Disposition: A | Discharge status: Home / Self Care | Source: Ambulatory Visit | Attending: Family Medicine | Admitting: Family Medicine

## 2023-08-28 DIAGNOSIS — M5412 Radiculopathy, cervical region: Secondary | ICD-10-CM

## 2023-09-15 IMAGING — MG MM DIGITAL SCREENING BILAT W/ TOMO AND CAD
6 of 10 series · 6 of 30 positions shown · non-contrast
Comparison: Previous exam(s).

CLINICAL DATA: Screening.

EXAM:
DIGITAL SCREENING BILATERAL MAMMOGRAM WITH TOMOSYNTHESIS AND CAD
TECHNIQUE: Bilateral screening digital craniocaudal and mediolateral oblique
mammograms were obtained. Bilateral screening digital breast
tomosynthesis was performed. The images were evaluated with
computer-aided detection.

[R CC synth-2D]
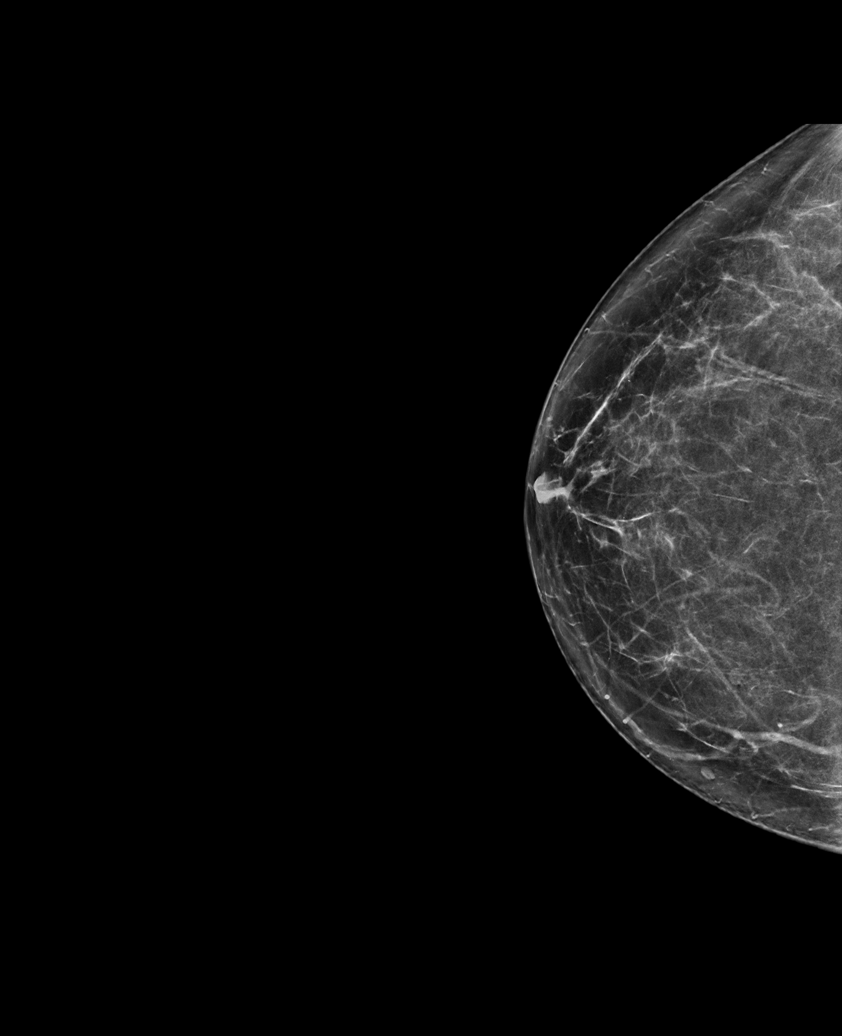

[L MLO synth-2D (1 of 2)]
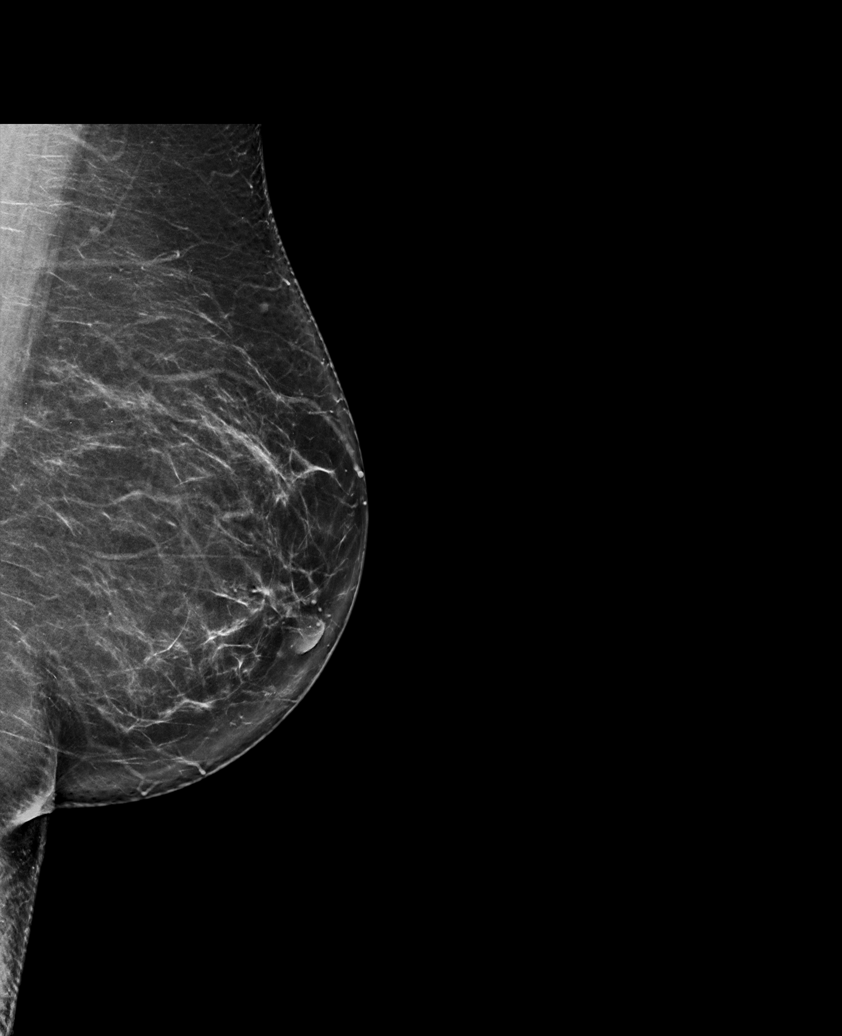

[R MLO synth-2D]
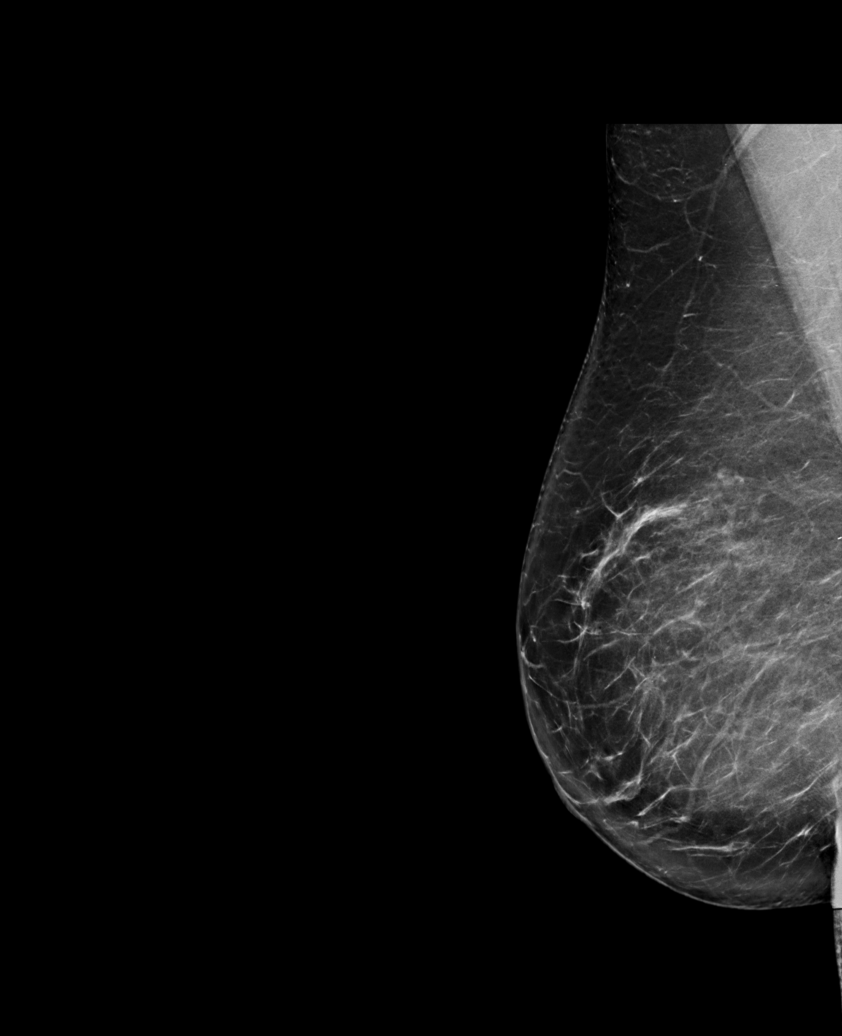

[L CC synth-2D]
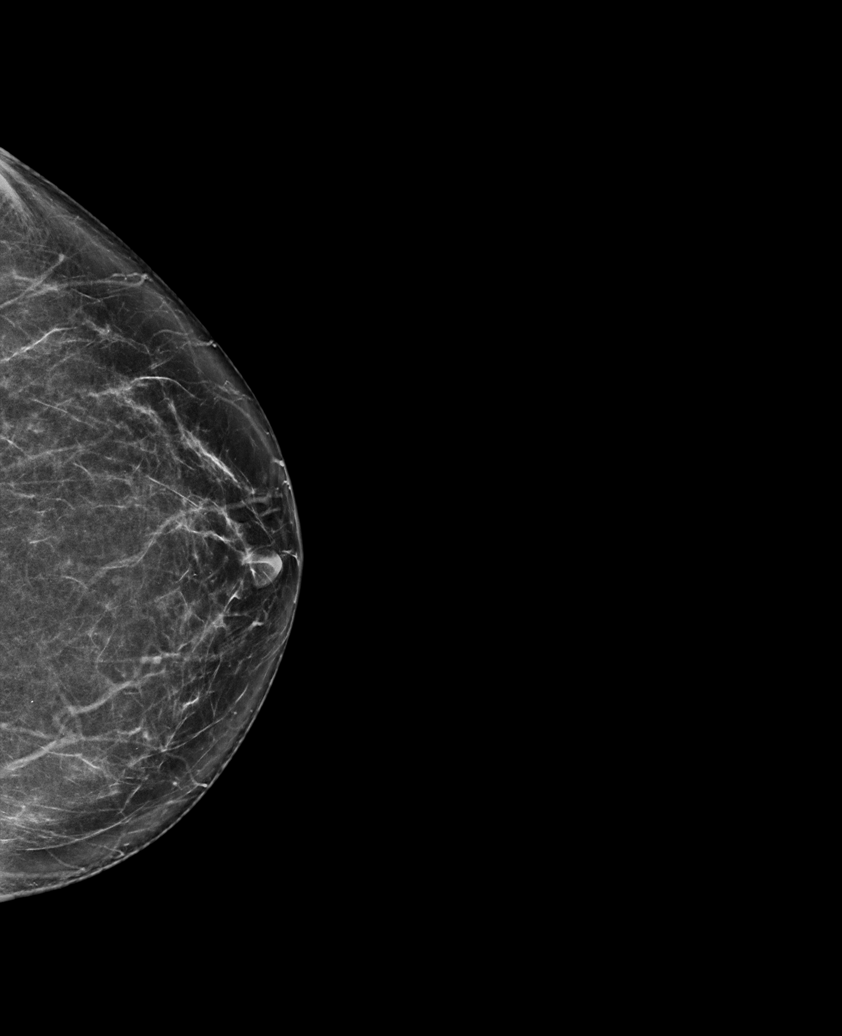

[L MLO synth-2D (2 of 2)]
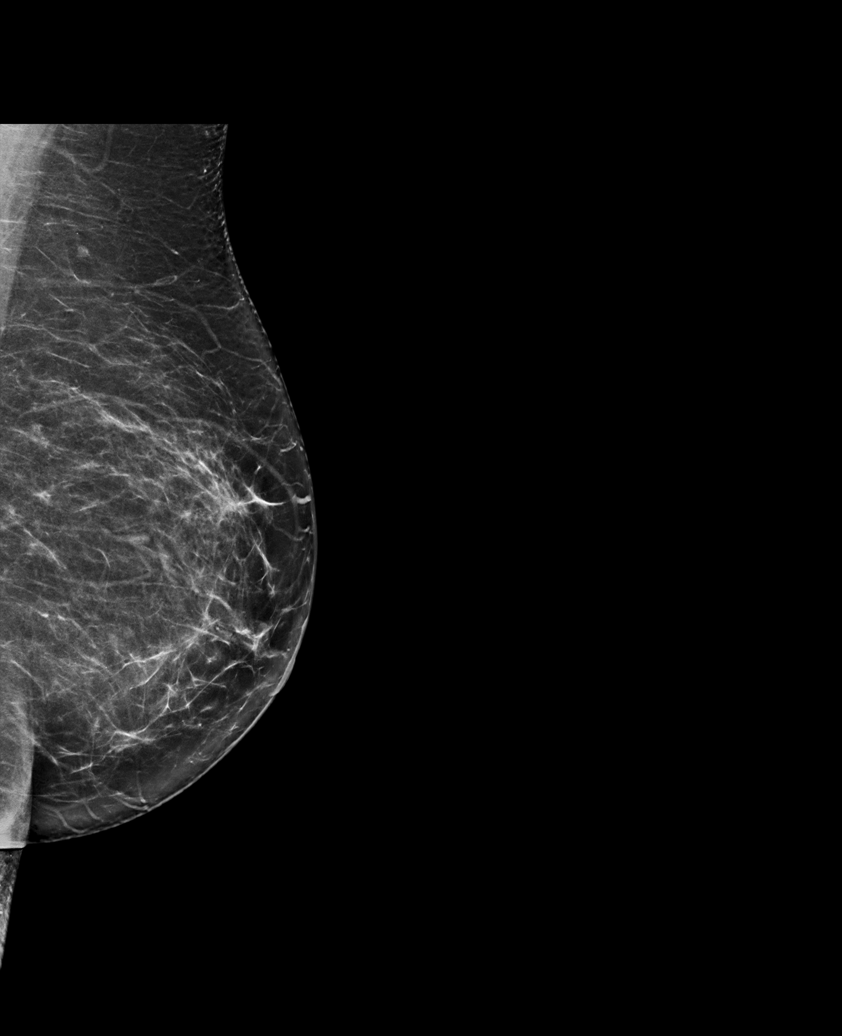

[R CC tomo · tomo slice 39/76.0]
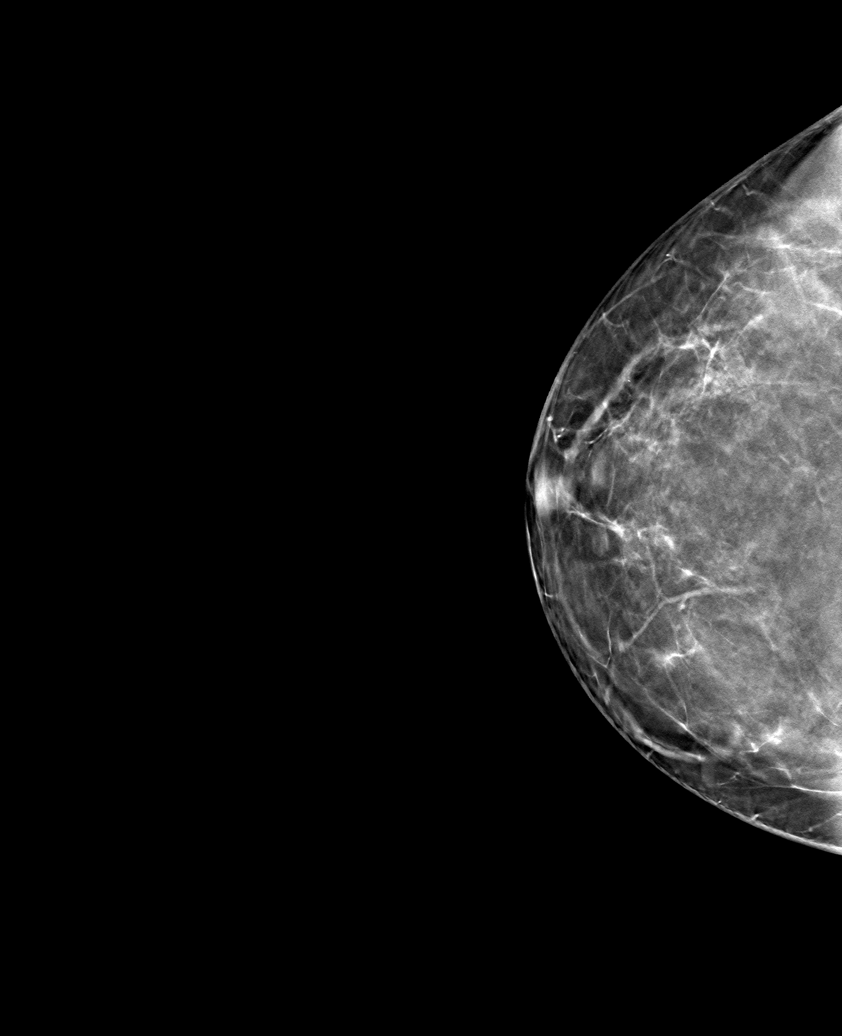

[6 of 30 positions shown; findings below may reference images not displayed]

ACR Breast Density Category b: There are scattered areas of
fibroglandular density.
FINDINGS: There are no findings suspicious for malignancy.
IMPRESSION: No mammographic evidence of malignancy. A result letter of this
screening mammogram will be mailed directly to the patient.

RECOMMENDATION:
Screening mammogram in one year. (Code:51-O-LD2)

BI-RADS CATEGORY  1: Negative.

## 2023-12-11 DIAGNOSIS — M25562 Pain in left knee: Secondary | ICD-10-CM | POA: Insufficient documentation

## 2024-01-31 ENCOUNTER — Ambulatory Visit: Admitting: Podiatry

## 2024-01-31 ENCOUNTER — Encounter: Payer: Self-pay | Admitting: Podiatry

## 2024-01-31 ENCOUNTER — Ambulatory Visit

## 2024-01-31 DIAGNOSIS — M0579 Rheumatoid arthritis with rheumatoid factor of multiple sites without organ or systems involvement: Secondary | ICD-10-CM | POA: Diagnosis not present

## 2024-01-31 DIAGNOSIS — G8929 Other chronic pain: Secondary | ICD-10-CM | POA: Insufficient documentation

## 2024-01-31 DIAGNOSIS — M778 Other enthesopathies, not elsewhere classified: Secondary | ICD-10-CM | POA: Diagnosis not present

## 2024-01-31 DIAGNOSIS — M47816 Spondylosis without myelopathy or radiculopathy, lumbar region: Secondary | ICD-10-CM | POA: Insufficient documentation

## 2024-01-31 MED ORDER — GABAPENTIN 300 MG PO CAPS: 300.0000 mg | ORAL_CAPSULE | Freq: Every day | ORAL | 3 refills | Status: AC

## 2024-01-31 NOTE — Progress Notes (Signed)
 She presents today for follow-up of pain in her feet.  She states that the pain is much worse now than it ever has been and I can barely walk.  She states that over the last couple of years she has been diagnosed with rheumatoid arthritis and is currently taking a biological to help with that.  However as of yet she is not improved.  She states that she is unable to stand and work due to the pain in her feet and legs.  She has tried anti-inflammatories orthotics injections physical therapy all to no avail.  She states that there is rheumatoid and the pain in her feet are ruining her life.  She states that she has to sleep in a recliner not only for her feet to feel better but also for her shoulder.  I have reviewed her past medical history medications allergies surgeries and social history.  She ambulates with an antalgic gait.  Pulses are palpable mild to moderate digital deformities and significant pain on palpation and range of motion.  Near complete atrophy of the fat pad in the forefoot muscle strength to all tendons crossing the ankle is 4/5.  Intrinsic musculature is weak with atrophy of the intrinsic musculature visible through the dorsum of the foot.  She does not have any rheumatoid nodularity that I can see.  Radiographs taken today demonstrate osseously mature individual with significant demineralization of the bone.  Radiographs bilateral also demonstrate significant arthritic changes to the metatarsal phalangeal joints with complete dislocation of 2nd and 3rd metatarsal phalangeal joints right foot and some degenerative change of the first metatarsal phalangeal joint.  However the left foot still considerable demineralization, but does demonstrate retained screw to the first metatarsal with severe osteoarthritis complete joint loss spurring and eburnation and sclerosis.  The 2nd and 3rd metatarsals left also demonstrate screw fixation and severe dislocation with arthritic destruction.  Assessment  lifestyle-limiting rheumatoid and osteo arthritis bilateral lower extremity.  Digital deformities and atrophy of the fat pad.  Plan: Discussed etiology pathology conservative versus surgical therapies at this point I do not advise any surgical procedures due to the poor bone quality and the severity of the disease state.  I do think that she would be a good candidate for partial disability for her feet.  I will follow-up with her in about a month.  I did start her on gabapentin 300 mg 1 p.o. nightly just to help her sleep and relieve some of the painful symptomatology.

## 2024-02-02 ENCOUNTER — Other Ambulatory Visit: Payer: Self-pay | Admitting: Internal Medicine

## 2024-02-02 DIAGNOSIS — Z1231 Encounter for screening mammogram for malignant neoplasm of breast: Secondary | ICD-10-CM

## 2024-02-13 ENCOUNTER — Telehealth: Payer: Self-pay | Admitting: Orthopedic Surgery

## 2024-02-13 NOTE — Telephone Encounter (Signed)
 You approved to this patient for a second opinion for her lt knee on a Tuesday pm.  Any Tuesday and what time would you like???  Thank you

## 2024-03-13 ENCOUNTER — Ambulatory Visit: Admitting: Podiatry

## 2024-03-19 ENCOUNTER — Encounter: Payer: Self-pay | Admitting: Orthopedic Surgery

## 2024-03-19 ENCOUNTER — Ambulatory Visit: Admitting: Orthopedic Surgery

## 2024-03-19 ENCOUNTER — Telehealth: Payer: Self-pay | Admitting: Orthopedic Surgery

## 2024-03-19 VITALS — BP 138/80 | HR 80 | Ht 67.0 in | Wt 223.0 lb

## 2024-03-19 DIAGNOSIS — M1712 Unilateral primary osteoarthritis, left knee: Secondary | ICD-10-CM

## 2024-03-19 NOTE — Progress Notes (Signed)
 "  Office Visit Note   Patient: Kelsey Holt           Date of Birth: Aug 13, 1960           MRN: [id number] Visit Date: 03/19/2024 Requested by: Auston Reyes JONETTA, MD [address] [facility] [place],  [place] [address] PCP: Auston Reyes JONETTA, MD   Assessment & Plan:   Encounter Diagnosis  Name Primary?   Primary osteoarthritis of left knee Yes    No orders of the defined types were placed in this encounter.   This is a 64 year old female with end-stage arthritis of her left knee and severe pain that has not responded to nonoperative treatment including anti-inflammatories, acetaminophen , steroid injection.  At this point I do not think she will improve with any nonoperative treatment and should consider total knee arthroplasty  Subjective: Chief Complaint  Patient presents with   Knee Pain    Left/ wants second opinion / had WC injury improved a little after surgery for meniscus repair now is worse     HPI: 64 year old female who had arthroscopic surgery on her left knee in 2016 at that time she had a partial lateral meniscectomy partial medial meniscectomy she had osteoarthritis and chondral debridement and did well until a few years ago where she started having pain again.  Last year the pain got worse and she sought medical attention with an orthopedist who took x-rays and thought that the knee looked fine.  She went back to Dr. Lemar office and saw PA Camellia Ellen and underwent 2 cortisone injections the first 1 helped for about 6 weeks the second 1 did not help at all.  The patient has had Aleve Tylenol  Motrin and over-the-counter medication without relief.  She is also have meloxicam  which does not help  She complains of severe pain in the left knee keeping her from getting a good night sleep.  The pain is in the front of the knee as well as the back she feels a fullness in the back of the knee as if there is a cyst back there.  When her knee is hurting  badly she cannot walk she has a limp but no giving way.  She complains that she can no longer squat her range of motion is decreased she gets intermittent swelling from the pes bursa across the knee joint  The patient is on Remvoq rheumatoid arthritis              ROS: neg for cp , sob, GI discomfort    Images personally read and my interpretation :  reports only. Flash drive did not download   MRI: Moderate tricompartmental osteoarthritis with significant chondromalacia most notably the medial compartment and lateral patellofemoral compartment with reactive joint effusion mild subchondral reactive edema and cystic changes likely degenerative horizontal tear of the anterior horn and anterior body lateral meniscus without displaced tear diminutive body and posterior horn medial meniscus suggesting prior partial meniscectomy no displaced medial meniscal tear is identified findings suggesting a chronic sprain of the MCL  Visit Diagnoses:  1. Primary osteoarthritis of left knee      Follow-Up Instructions: No follow-ups on file.    Objective: Vital Signs: BP 138/80   Pulse 80   Ht 5' 7 (1.702 m)   Wt 223 lb (101.2 kg)   BMI 34.93 kg/m   Physical Exam Vitals and nursing note reviewed.  Constitutional:      Appearance: Normal appearance.  HENT:  Head: Normocephalic and atraumatic.  Eyes:     General: No scleral icterus.       Right eye: No discharge.        Left eye: No discharge.     Extraocular Movements: Extraocular movements intact.     Conjunctiva/sclera: Conjunctivae normal.     Pupils: Pupils are equal, round, and reactive to light.  Cardiovascular:     Rate and Rhythm: Normal rate.     Pulses: Normal pulses.  Musculoskeletal:     Left knee: No effusion.     Instability Tests: Medial McMurray test negative.  Skin:    General: Skin is warm and dry.     Capillary Refill: Capillary refill takes less than 2 seconds.  Neurological:     General: No focal deficit  present.     Mental Status: She is alert and oriented to person, place, and time.  Psychiatric:        Mood and Affect: Mood normal.        Behavior: Behavior normal.        Thought Content: Thought content normal.        Judgment: Judgment normal.      Right Knee Exam   Range of Motion  Extension:  normal  Flexion:  120    Left Knee Exam   Tenderness  Left knee tenderness location: Medial joint line, lateral patellofemoral joint, lateral joint line, medial femoral condyle.  Range of Motion  Extension:  5  Flexion:  110   Tests  McMurray:  Medial - negative  Varus: negative Valgus: negative Lachman:  Anterior - negative     Drawer:  Anterior - negative     Posterior - negative  Other  Erythema: absent Sensation: normal Pulse: present Swelling: none Effusion: no effusion present  Comments:  Pes anserine bursa swelling and tenderness       Specialty Comments:  No specialty comments available.  Imaging: No results found.   PMFS History: Patient Active Problem List   Diagnosis Date Noted   Arthropathy of lumbar facet joint 01/31/2024   Lumbar spondylosis 01/31/2024   Chronic low back pain 01/31/2024   Arthralgia of left knee 12/11/2023   Prediabetes 05/30/2023   Rheumatoid arthritis involving multiple sites with positive rheumatoid factor (HCC) 04/22/2022   Impingement syndrome of shoulder region 07/21/2021   HTN, goal below 140/80 10/09/2019   Acquired hypothyroidism 08/14/2018   Pure hypercholesterolemia 05/16/2018   SUI (stress urinary incontinence, female) 12/05/2017   Menopause 11/30/2016   Vaginal atrophy 11/30/2016   Status post vaginal hysterectomy 11/30/2016   Mild obesity 10/21/2015   Arthritis of knee, left 01/28/2015   Borderline hypertension 01/28/2015   Generalized OA 01/28/2015   Past Medical History:  Diagnosis Date   Arthritis    History of heavy periods    Hypertension    borderline   Menopause    S/P vaginal  hysterectomy    d/t menorroghia   Seasonal allergies    SUI (stress urinary incontinence, female)    Vaginal atrophy    Wears glasses     Family History  Problem Relation Age of Onset   Breast cancer Neg Hx    Ovarian cancer Neg Hx    Colon cancer Neg Hx    Diabetes Neg Hx    Heart disease Neg Hx     Past Surgical History:  Procedure Laterality Date   ABDOMINAL HYSTERECTOMY     COLONOSCOPY WITH PROPOFOL  N/A 05/01/2015   Procedure: COLONOSCOPY  WITH PROPOFOL ;  Surgeon: Donnice Vaughn Manes, MD;  Location: [facility] ENDOSCOPY;  Service: Endoscopy;  Laterality: N/A;   DILATION AND CURETTAGE OF UTERUS     FOOT ARTHROTOMY  2010   bilateral toes repaired   KNEE ARTHROSCOPY WITH MEDIAL MENISECTOMY Left 06/30/2014   Procedure: KNEE ARTHROSCOPY WITH MEDIAL MENISECTOMY;  Surgeon: Dempsey Sensor, MD;  Location: [facility];  Service: Orthopedics;  Laterality: Left;  chondromalatia loose bodly removal partial lateral and medial menisectomy   PARTIAL HYSTERECTOMY     TUBAL LIGATION     Social History   Occupational History   Not on file  Tobacco Use   Smoking status: Former    Current packs/day: 0.00    Types: Cigarettes    Quit date: 06/25/2012    Years since quitting: 11.7   Smokeless tobacco: Former  Building Services Engineer status: Never Used  Substance and Sexual Activity   Alcohol use: Yes    Comment: occ   Drug use: No   Sexual activity: Yes    Birth control/protection: Surgical       "

## 2024-03-19 NOTE — Progress Notes (Signed)
" °  Intake history:  Chief Complaint  Patient presents with   Knee Pain    Left/ wants second opinion / had WC injury improved a little after surgery for meniscus repair now is worse      BP 138/80   Pulse 80   Ht 5' 7 (1.702 m)   Wt 223 lb (101.2 kg)   BMI 34.93 kg/m  Body mass index is 34.93 kg/m.  Pharmacy? __[facility] ____________________________________  WHAT ARE WE SEEING YOU FOR TODAY?   Left knee  How long has this bothered you? (DOI?DOS?WS?)  2016 had surgery improved a little but now pain is worse   Was there an injury? No recent injury  Anticoag.  No   Any ALLERGIES __Allergies[1] ____________________________________________   Treatment:  Have you taken:  Tylenol  Yes  Advil Yes  Had PT Yes last summer  Had injection Yes  Other  __________had MRI at [facility]/ [facility] _______________        [1]  Allergies Allergen Reactions   Penicillins Hives   "

## 2024-03-19 NOTE — Telephone Encounter (Signed)
 This patient was scheduled by me on 02/28/24.  At the time of scheduling the patient had [employer].  She presented today w/[facility] .  [facility]key advised the patient that we are not in network w/her insurance.  The patient stated that her agent told her that we were in network and D[facility]key again told her we are not.  The patient chose to still be seen and D[facility] collected a $90 copay and told her she would receive a bill for anything else she may owe.
